# Patient Record
Sex: Female | Born: 1955
Health system: Southern US, Community
[De-identification: ages and names within clinical notes are randomized; demographics above are authoritative.]

## PROBLEM LIST (undated history)

## (undated) DIAGNOSIS — G4730 Sleep apnea, unspecified: Secondary | ICD-10-CM

## (undated) DIAGNOSIS — I10 Essential (primary) hypertension: Secondary | ICD-10-CM

## (undated) DIAGNOSIS — M1990 Unspecified osteoarthritis, unspecified site: Secondary | ICD-10-CM

## (undated) DIAGNOSIS — K759 Inflammatory liver disease, unspecified: Secondary | ICD-10-CM

## (undated) DIAGNOSIS — E119 Type 2 diabetes mellitus without complications: Secondary | ICD-10-CM

## (undated) DIAGNOSIS — M797 Fibromyalgia: Secondary | ICD-10-CM

## (undated) DIAGNOSIS — F329 Major depressive disorder, single episode, unspecified: Secondary | ICD-10-CM

## (undated) DIAGNOSIS — J189 Pneumonia, unspecified organism: Secondary | ICD-10-CM

## (undated) DIAGNOSIS — J45909 Unspecified asthma, uncomplicated: Secondary | ICD-10-CM

## (undated) DIAGNOSIS — K219 Gastro-esophageal reflux disease without esophagitis: Secondary | ICD-10-CM

## (undated) DIAGNOSIS — E785 Hyperlipidemia, unspecified: Secondary | ICD-10-CM

## (undated) DIAGNOSIS — F32A Depression, unspecified: Secondary | ICD-10-CM

## (undated) DIAGNOSIS — D649 Anemia, unspecified: Secondary | ICD-10-CM

## (undated) HISTORY — PX: CARPAL TUNNEL RELEASE: SHX101

## (undated) HISTORY — DX: Hyperlipidemia, unspecified: E78.5

## (undated) HISTORY — PX: HAND TENDON SURGERY: SHX663

## (undated) HISTORY — PX: ABDOMINAL HYSTERECTOMY: SHX81

## (undated) HISTORY — PX: CHOLECYSTECTOMY: SHX55

---

## 2005-09-04 ENCOUNTER — Ambulatory Visit: Payer: Self-pay | Admitting: Gastroenterology

## 2007-07-24 HISTORY — PX: JOINT REPLACEMENT: SHX530

## 2007-11-12 ENCOUNTER — Inpatient Hospital Stay (HOSPITAL_COMMUNITY): Admission: RE | Admit: 2007-11-12 | Discharge: 2007-11-16 | Payer: Self-pay | Admitting: Orthopedic Surgery

## 2009-05-20 IMAGING — CR DG KNEE 1-2V PORT*R*
2 series · 2 of 2 positions shown · non-contrast
Comparison: None

CLINICAL DATA: Postop right knee replacement.

PORTABLE RIGHT KNEE - 1-2 VIEW

[view not recorded (1 of 2)]
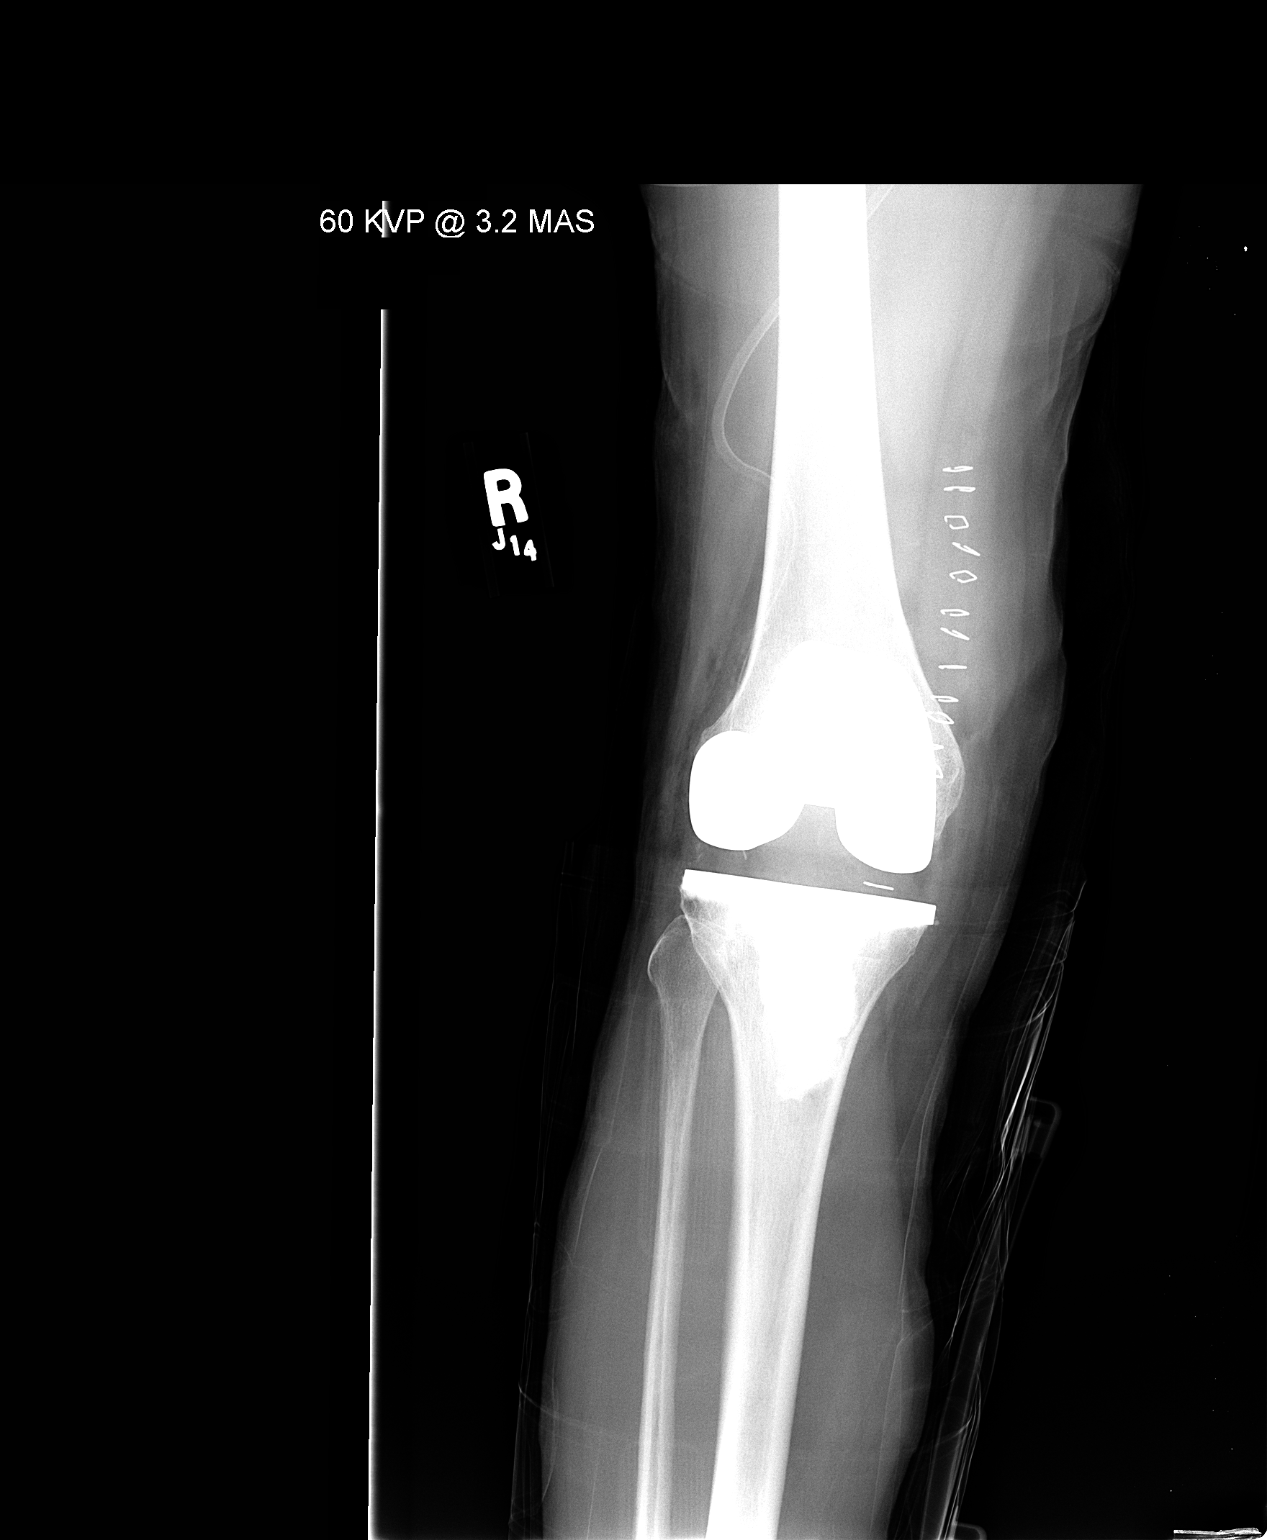

[view not recorded (2 of 2)]
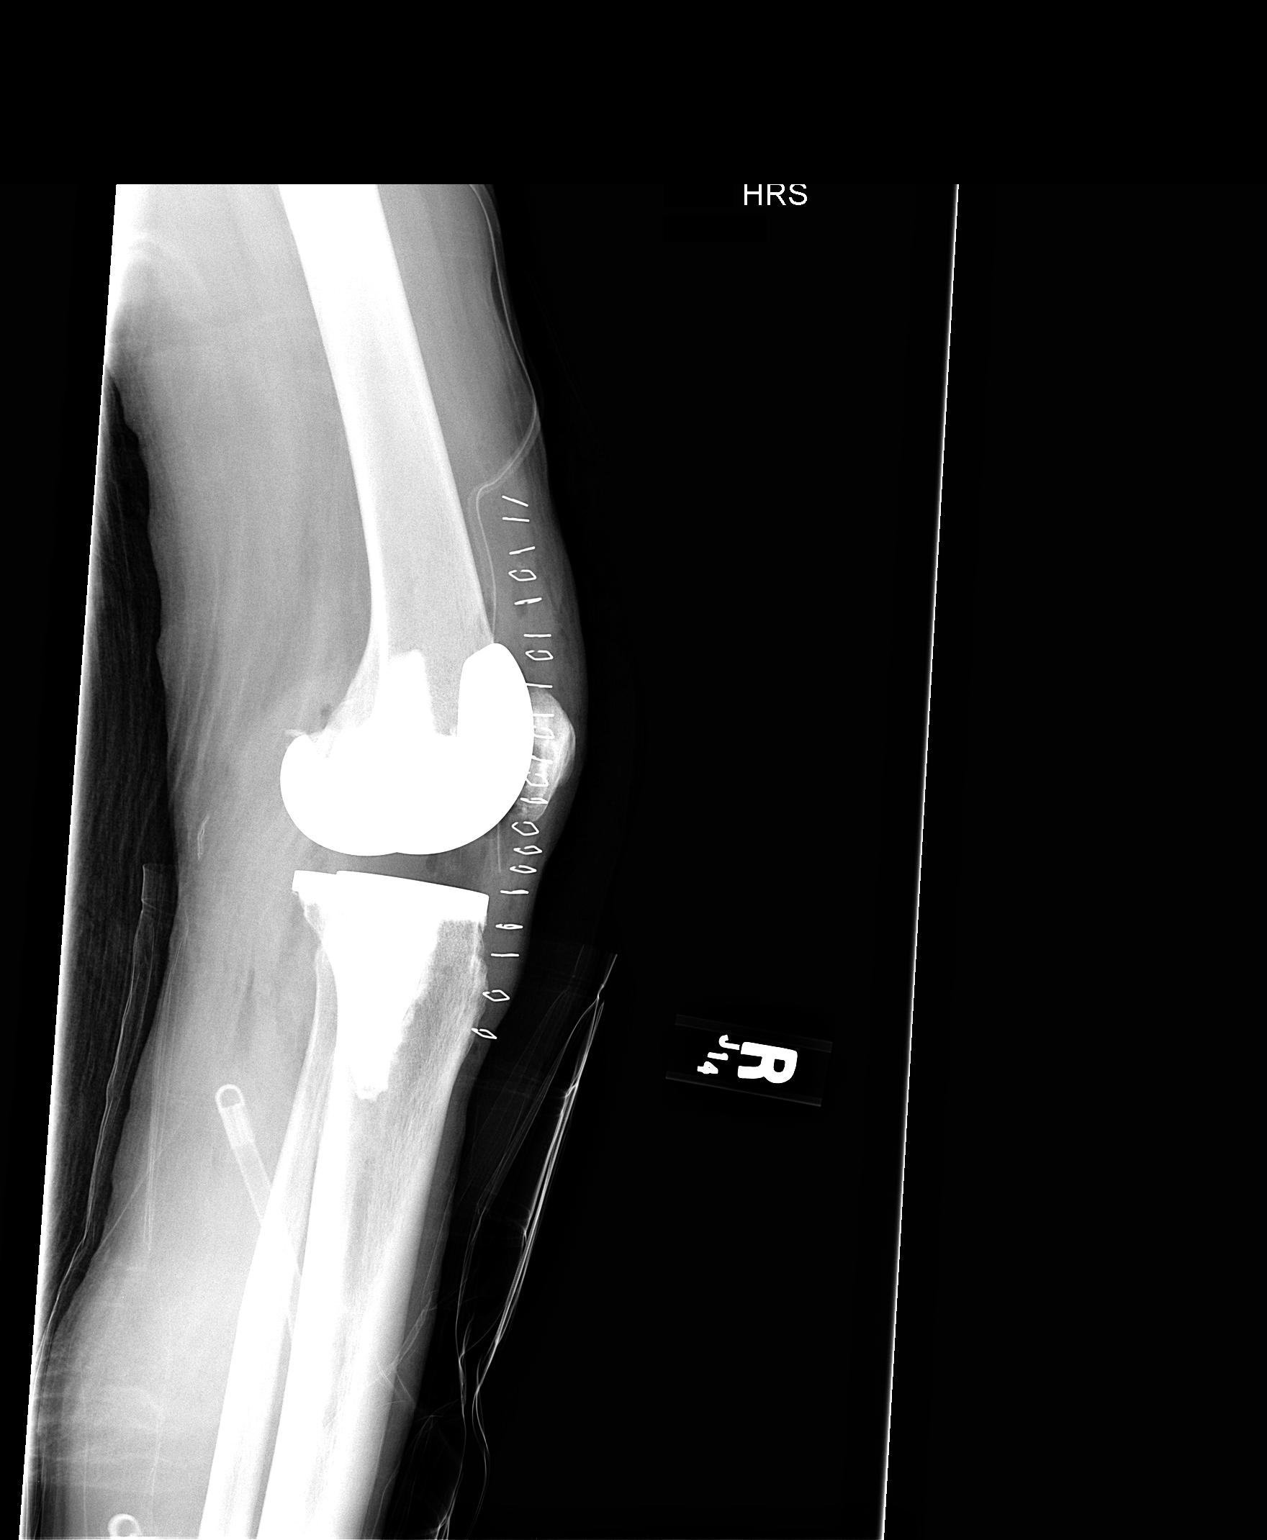

[2 of 2 positions shown; findings below may reference images not displayed]

FINDINGS: The patient is status post right total knee arthroplasty.
Subcutaneous and joint air are noted.  Surgical drain and skin
staples are in place.
IMPRESSION: Right total knee arthroplasty without immediate complicating
feature.

## 2010-11-30 DIAGNOSIS — R52 Pain, unspecified: Secondary | ICD-10-CM

## 2010-11-30 DIAGNOSIS — M25569 Pain in unspecified knee: Secondary | ICD-10-CM

## 2010-11-30 HISTORY — DX: Pain, unspecified: R52

## 2010-11-30 HISTORY — DX: Pain in unspecified knee: M25.569

## 2010-12-05 NOTE — H&P (Signed)
NAME:  Natasha Meyer, Natasha Meyer           ACCOUNT NO.:  [id number]   MEDICAL RECORD NO.:  [id number]          PATIENT TYPE:  INP   LOCATION:  NA                           FACILITY:  [facility]   PHYSICIAN:  Georges Lynch. Gioffre, M.D.DATE OF BIRTH:  Nov 28, 1955   DATE OF ADMISSION:  11/12/2007  DATE OF DISCHARGE:                              HISTORY & PHYSICAL   New married name [name]   CHIEF COMPLAINT:  Painful loss of range of motion, right knee.   HISTORY OF PRESENT ILLNESS:  The patient is a 55 year old female here  today for history and physical of her upcoming right total knee  arthroplasty by Dr. Darrelyn Hillock at [facility] on November 12, 2007.  The patient has a long-term history of painful range of motion of the  right knee.  Her x-rays reveal significant arthritic changes of the  knee.  The patient had failed arthroscopy and conservative treatment and  elected proceed with a knee replacement.   ALLERGIES:  1. PREDNISONE.  2. CODEINE.  3. HYDROCODONE.  4. ZESTRIL.  5. SULFA.  6. THE PATIENT IS TO AVOID ANY TYLENOL PRODUCT DUE TO HEPATITIS C.  7. LATEX ALLERGY.   CURRENT MEDICATIONS:  1. Premarin 0.625 mg a day.  2. Ropinirole HCL 0.5 mg nightly.  3. __________ 100 mg p.o. nightly.  4. __________ 20/12.5 mg 1 tablet a day.  5. Clonazepam 1 mg three times a day as needed.  6. Xopenex inhaling nebulization 0.63 mg in 3 mL once four times a      day.  7. OxyIR 5 mg 1 tablet four times a day.  8. Spiriva multidose inhaler p.r.n.   PAST MEDICAL HISTORY:  1. Right knee degenerative arthritis.  2. COPD.  3. Depressive disorder.  4. Anxiety.  5. Hypertension.  6. Reflux disease.  7. Asymptomatic hepatitis C.  8. Recent pneumonia in which she was hospitalized in November.  9. Tobacco use.   REVIEW OF SYSTEMS:  Positive for some anxiety and depression, but she  states it is fairly well stable on her current medications.  PULMONARY:  She has had recent pulmonary bronchitis and  pneumonia for which she was  hospitalized in November.  She states she has recently had a pretty good  respiratory status.  She does smoke about one pack a day, and she was  states she wants to quit.  CARDIOVASCULAR:  She denies any recent  cardiac chest pains, irregular heart rhythms or hypertension.  Her  medications been stable.  REFLUX:  She does have some issues with some  reflux disease.  She has been diagnosed with hepatitis C a long time  ago, possibly from a blood transfusion.  She states she is currently  asymptomatic.  She does have issues with hemorrhoids.  GU:  She does  have a little bit of urinary incontinence.  ENDOCRINE:  She denies any  thyroid disease.  She says she has had a couple of low hypoglycemic  events in the past, nothing recently.  HEMATOLOGIC:  She has had a blood  transfusion the past in which she thinks she developed hepatitis C,  but  no other issues.  She is postmenopausal.   PAST SURGICAL HISTORY:  1. T&A in 1974.  2. Hysterectomy in 1988.  3. Trigger fingers, carpal tunnel release in 2001.  4. Arthroscopies in 2006 in 2008.  She states the only complication      she had is a little nausea with anesthesia.   FAMILY MEDICAL HISTORY:  Father is alive at age of 71, with a history of  CABG and hypertension.  Mother is alive at 19 years of age with diabetes  and a hiatal hernia.   SOCIAL HISTORY:  The patient is recently married.  She is currently a  CNA.  She currently smokes about one pack a day for last 31 years.  She  denies any alcohol or drug use.  She has one daughter.  She will be  living at home in a one-story house with her husband.   PRIMARY CARE PHYSICIAN:  Dr. Foye Deer in [facility], and he has done a medical evaluation and has cleared her for  this upcoming surgical procedure.   PHYSICAL EXAMINATION:  VITAL SIGNS:  Height is 4 feet 10 inches, weight  is 118 pounds.  Blood pressure is 104/62, pulse of 84 and  regular,  respirations are 16 and nonlabored.  She is afebrile.  GENERAL:  This is short-statured healthy-appearing female.  Conscious,  alert and appropriate.  She obviously smokes.  She has a small odor of  tobacco.  HEENT:  Head was normocephalic.  Gross hearing is intact.  Oral buccal  mucosa is pink and moist.  Dentition is in fair repair.  NECK:  Supple.  Good range of motion.  No palpable lymphadenopathy.  CHEST:  Lung sounds; she did have a little bit of inspiratory wheezing  bilaterally, but lung sounds were equal and clear.  HEART:  Regular rate and rhythm.  No murmurs, rubs or gallops.  ABDOMEN:  Soft.  Bowel sounds present.  UPPER EXTREMITIES:  With good range of motion of the shoulders, elbows  and wrists.  Motor strength 5/5.  LOWER EXTREMITIES:  Both hips have full extension, flexion up to 130  with 20-30 degrees internal-external rotation without any discomfort.  Right knee, she was very gingerly able to fully extend it.  She had an  obvious valgus deformity.  She was able to flex it back to about 110  degrees.  She did have some play with valgus-varus stressing, but no  gross instability.  No signs of infection.  Her calf was soft and  nontender.  Left knee had full extension and flexion.  No instability.  Calf was soft and nontender.  Ankles had good range of motion.  PERIPHERAL VASCULAR:  Carotid pulses were 2+, no bruits.  Radial pulses  2+.  Dorsalis pedis pulses were 1+.  She had no lower extremity edema.  NEURO:  The patient was conscious, alert and appropriate, appears to be  good historian.  She had no gross neurologic defects noted.  BREAST/RECTAL/GU:  Deferred at this time.   IMPRESSION:  1. Severe osteoarthritis with valgus deformity right knee.  2. Anxiety/depression.  3. Chronic obstructive pulmonary disease with tobacco use with recent      hospitalized pneumonia in November.  4. Hypertension.  5. Reflux disease.  6. Hepatitis C.  7. Some urinary  incontinence.  8. Hemorrhoids.   PLAN:  The patient has been evaluated by her primary care physician and  has been cleared for this upcoming procedure.  The patient will undergo  all routine labs and tests prior to having a right total knee  arthroplasty by Dr. Darrelyn Hillock at [facility] on November 12, 2007.      Jamelle Rushing, P.A.    ______________________________  Georges Lynch Darrelyn Hillock, M.D.    RWK/MEDQ  D:  11/06/2007  T:  11/06/2007  Job:  [id number]   cc:   Windy Fast A. Darrelyn Hillock, M.D.  Fax: [phone]

## 2010-12-05 NOTE — Op Note (Signed)
NAME:  Natasha Meyer, Natasha Meyer           ACCOUNT NO.:  [id number]   MEDICAL RECORD NO.:  [id number]          PATIENT TYPE:  INP   LOCATION:  0001                         FACILITY:  [facility]   PHYSICIAN:  Georges Lynch. Gioffre, M.D.DATE OF BIRTH:  [birth year]   DATE OF PROCEDURE:  11/12/2007  DATE OF DISCHARGE:                               OPERATIVE REPORT   SURGEON:  Georges Lynch. Darrelyn Hillock, M.D.   ASSISTANT:  Jamelle Rushing, P.A.   PREOPERATIVE DIAGNOSIS:  Severe degenerative arthritis of the right knee  with valgus deformity.   POSTOPERATIVE DIAGNOSIS:  Severe degenerative arthritis of the right  knee with valgus deformity.   OPERATION:  Right total knee arthroplasty utilizing the DePuy system.  I  utilized a size two cemented tibial tray with a rotating platform 10 mm  thickness, size 2.  The femoral component was a size 2 posterior  stabilized.  The patella was a 32-mm patella was three pegs.  I utilized  vancomycin in the cement.  All three components were cemented.   PROCEDURE:  Under spinal anesthesia routine orthopedic prep and draping  of the right lower extremity was carried out.  At this time the leg was  exsanguinated and Esmarch tourniquet was elevated 300 mmHg.  Following  that a midline incision was made over the anterior aspect of the right  knee.  Bleeders identified and cauterized.  I then carried out a median  parapatellar approach, reflected patella laterally, flexed the knee.  I  did medial and lateral meniscectomies and excised the anterior posterior  cruciate ligament.  Following that, I did a synovectomy.  Following  that, initial drill hole was made in the intercondylar notch of the  femur, #1 jig was inserted and  11 mm thickness of distal femur was  removed.  I next utilized a sizing guide, measured the femur to be a  size 2.  We next inserted #2 jig, then did our anterior-posterior  chamfer cuts for a size 2 femur.  Following that we went on and prepared  the tibia  measured tibia tray to be a size 2.  We cut our keel cut of  the tibia plateau in the usual fashion.  We then went on and made our  notch cut of the femur and then inserted our trial components.  We felt  a size 2 femur, size 2 tibia with a 10 mm thickness insert was the most  stable with good flexion, good extension and good lateral medial  stability.  Note, we did this only after we used our spacer blocks.  We  then prepared the patella, removed the appropriate amount of patella off  for resurfacing procedure.  Three drill holes were made in the patella  for a size 32 patella.  The removed all components, water-picked the  knee outside the knee and inserted Gelfoam up into the femoral canal,  down into the tibial canal and then cemented all three components in  simultaneously, utilizing vancomycin in the cement.  Following that we  then injected 30 mL of 0.5% Marcaine with epinephrine and Toradol 30 mg.  We injected in  the soft tissue structures.  We then closed the wound  layers in the usual fashion after the permanent tibial insert was  inserted.  We closed the wound over Hemovac drain.  Skin was closed with  metal staples.  Sterile Neosporin dressing was applied.  The patient had  1 gram of IV Ancef preop.           ______________________________  Georges Lynch. Darrelyn Hillock, M.D.    RAG/MEDQ  D:  11/12/2007  T:  11/12/2007  Job:  [id number]

## 2010-12-08 NOTE — Discharge Summary (Signed)
NAME:  Natasha Meyer, Natasha Meyer           ACCOUNT NO.:  [id number]   MEDICAL RECORD NO.:  [id number]          PATIENT TYPE:  INP   LOCATION:  1528                         FACILITY:  [facility]   PHYSICIAN:  Georges Lynch. Gioffre, M.D.DATE OF BIRTH:  1956/04/09   DATE OF ADMISSION:  11/12/2007  DATE OF DISCHARGE:  11/16/2007                               DISCHARGE SUMMARY   ADMISSION DIAGNOSES:  1. Severe osteoarthritis with deformity of the right knee.  2. Anxiety/depression.  3. Chronic obstructive pulmonary disease.  4. Tobacco use.  5. Hypertension.  6. Reflux disease.  7. History of hepatitis C.  8. History of urinary incontinence.  9. Hemorrhoids.   DISCHARGE DIAGNOSES:  1. Right total knee arthroplasty.  2. Chronic obstructive pulmonary disease.  3. History of tobacco use.  4. Anxiety/depression.  5. Hypertension.  6. Reflux disease.  7. History of hepatitis C, told to avoid Tylenol.  8. History urinary incontinence.  9. History of hemorrhoids.   HISTORY OF PRESENT ILLNESS:  The patient is a 55 year old female with a  long-term history of painful range of motion of her right knee. X-rays  reveal she has significant arthritic changes of the knee with valgus  deformity.  She has failed arthroscopies, conservative treatment and has  elected to proceed with a total knee arthroplasty.   ALLERGIES:  PREDNISONE, CODEINE, HYDROCODONE, ZESTRIL, SULFA, LATEX  ALLERGY, WAS TOLD TO VOID TYLENOL DUE TO HEPATITIS C.   CURRENT MEDICATIONS:  1. Premarin 0.625 mg a day.  2. Ropinirole HCL 0.5 mg a night.  3. __________ 100 mg at night.  4. Quinapril/hydrochlorothiazide 20/12.5 mg a day.  5. Clonazepam 1 mg three times a day.  6. Xopenex inhaling nebulizer 0.63 mg in 3 mL four times a day.  7. OxyIR 1 tablet four times a day.  8. Spiriva multidose inhaler p.r.n.   SURGICAL PROCEDURES:  On November 12, 2007 the patient was taken to the OR  by Dr. Worthy Rancher, assisted by Oneida Alar PA-C.  Under  general anesthesia  the patient underwent a right total knee arthroplasty without any  complications under general anesthesia. The patient had minimal blood  loss.  The patient was transferred to the recovery room and the  orthopedic floor in good condition for total knee protocol with IV pain  medicines, antibiotics and DVT prophylaxis.  The patient had the  following components implanted:  A size two right femoral component,  size two keel tibial tray, a size 2, 10-mm polyethylene bearing, a size  32 three peg patella.  All components were implanted with polymethyl  methacrylate with vancomycin mixed in.   CONSULTS:  The following routine consults requested:  Physical therapy,  case management, pharmacy for DVT prophylaxis.   HOSPITAL COURSE:  On November 12, 2007 the patient was admitted to [facility] under the care of Dr. Worthy Rancher.  The patient was taken  to the OR where a right total knee arthroplasty was performed without  any complications.  The patient was transferred to recovery room then to  orthopedic floor in good condition for total knee protocol with IV  antibiotics, pain medicines and Coumadin and heparin for DVT  prophylaxis.  The patient then incurred 3 days postoperative course in  which the patient initially had quite a bit of discomfort in her knee  but she was encouraged to use the p.o. medications along with the PCA  and this did bring her pain under control.  The patient's pain  medication was changed over to OxyIR p.o. to avoid the Tylenol.  The  patient had a preoperative potassium of 3.2 and this was rechecked and  it was 3.6 in the hospital so no changes were made.  The patient's wound  remained benign for signs of infections.  Her leg remained neuromotor  vascularly intact.  The patient was able to wean off  IV pain medicines  to be well-controlled on p.o. medications.  The patient continued to  work well with physical therapy.  The patient was  felt to be  orthopedically stable and medically stable, ready for discharge home on  postop day #4.  Arrangements were made for outpatient home health and  she was discharged in good condition.   LABORATORY DATA:  WBCs on admission found WBCs 13.5, hemoglobin 14.5,  hematocrit 43.5, platelets 382.  Hemoglobin on discharge was 11.2 with a  hematocrit of 32.7.  INR on discharge was 2.9 with the pharmacy  adjusting her Coumadin.  The patient's pre admissions chemistries found  potassium of 3.2 and she called in some potassium preoperatively and  take that.  Postop her sodium was 134, potassium was 3.6, glucose 200,  BUN 3, creatinine 0.51 with an estimated GFR greater than 60.  Her  glucose was related to her diet and inactivity during her  hospitalization.  Her glucose was 95 on admission so no changes were  made.  Urinalysis on admission was normal.   EKG was normal sinus rhythm of 91 beats on admission.   Chest x-ray on admission found no active disease.   DISCHARGE INSTRUCTIONS:   DIET:  No restrictions.   ACTIVITY:  The patient is to slowly increase activity with use of a  walker.   WOUND CARE:  The patient is to change her dressing on a daily basis.   FOLLOW UP:  The patient needs a follow-up appointment with Dr. Darrelyn Hillock  in his office 2 weeks from date of discharge.  The patient is to call  [phone] for that follow-up appointment.   MEDICATIONS:  1. Premarin 0.625 mg once a day.  2. Quinapril/hydrochlorothiazide 20/12.5 mg once a day.  3. Ropinirole 0.5 mg once a day.  4. __________ 100 mg once a day.  5. Clonazepam 1 mg three times a day if needed.  6. Xopenex inhaler 45 mcg two puffs four times a day.  7. Spiriva hand held neb once day as needed.  8. Robaxin 500 mg one tablet every six hours if needed.  9. OxyIR 5 mg one to two tablets every four to six hours for pain if      needed.  10.Coumadin to be adjusted by [facility]       pharmacist.   CONDITION:  The patient's condition upon discharge to home is listed as  improved and good.      Jamelle Rushing, P.A.    ______________________________  Georges Lynch Darrelyn Hillock, M.D.    RWK/MEDQ  D:  11/26/2007  T:  11/26/2007  Job:  [id number]   cc:   Windy Fast A. Darrelyn Hillock, M.D.  Fax: [phone]

## 2011-04-17 LAB — DIFFERENTIAL
Basophils Absolute: 0.2 — ABNORMAL HIGH
Basophils Relative: 1
Eosinophils Absolute: 0.9 — ABNORMAL HIGH
Monocytes Absolute: 1.1 — ABNORMAL HIGH
Monocytes Relative: 8

## 2011-04-17 LAB — HEMOGLOBIN AND HEMATOCRIT, BLOOD
HCT: 32.7 — ABNORMAL LOW
HCT: 33.2 — ABNORMAL LOW
HCT: 35.3 — ABNORMAL LOW
HCT: 38.6
Hemoglobin: 11.2 — ABNORMAL LOW
Hemoglobin: 12.2
Hemoglobin: 13.1

## 2011-04-17 LAB — BASIC METABOLIC PANEL
BUN: 3 — ABNORMAL LOW
CO2: 27
Chloride: 99
Creatinine, Ser: 0.51
Glucose, Bld: 200 — ABNORMAL HIGH

## 2011-04-17 LAB — CBC
HCT: 43.5
Hemoglobin: 14.5
Platelets: 382
RDW: 13.9
WBC: 13.5 — ABNORMAL HIGH

## 2011-04-17 LAB — PROTIME-INR
INR: 1.9 — ABNORMAL HIGH
Prothrombin Time: 22.7 — ABNORMAL HIGH
Prothrombin Time: 31.3 — ABNORMAL HIGH

## 2011-04-17 LAB — COMPREHENSIVE METABOLIC PANEL
ALT: 25
Albumin: 3.6
Alkaline Phosphatase: 68
Chloride: 99
Glucose, Bld: 95
Potassium: 3.2 — ABNORMAL LOW
Sodium: 140
Total Bilirubin: 0.7
Total Protein: 6.6

## 2011-04-17 LAB — URINALYSIS, ROUTINE W REFLEX MICROSCOPIC
Glucose, UA: NEGATIVE
Ketones, ur: NEGATIVE
Nitrite: NEGATIVE
Specific Gravity, Urine: 1.006
pH: 7

## 2011-04-17 LAB — TYPE AND SCREEN

## 2011-04-17 LAB — ABO/RH: ABO/RH(D): O POS

## 2013-05-07 DIAGNOSIS — B182 Chronic viral hepatitis C: Secondary | ICD-10-CM | POA: Insufficient documentation

## 2013-05-07 HISTORY — DX: Chronic viral hepatitis C: B18.2

## 2015-05-27 DIAGNOSIS — M1712 Unilateral primary osteoarthritis, left knee: Secondary | ICD-10-CM

## 2015-05-27 HISTORY — DX: Unilateral primary osteoarthritis, left knee: M17.12

## 2015-07-26 DIAGNOSIS — D500 Iron deficiency anemia secondary to blood loss (chronic): Secondary | ICD-10-CM | POA: Diagnosis not present

## 2015-07-26 DIAGNOSIS — K2960 Other gastritis without bleeding: Secondary | ICD-10-CM | POA: Diagnosis not present

## 2015-07-26 DIAGNOSIS — K589 Irritable bowel syndrome without diarrhea: Secondary | ICD-10-CM | POA: Diagnosis not present

## 2015-07-26 DIAGNOSIS — J310 Chronic rhinitis: Secondary | ICD-10-CM | POA: Diagnosis not present

## 2015-07-28 DIAGNOSIS — N951 Menopausal and female climacteric states: Secondary | ICD-10-CM | POA: Diagnosis not present

## 2015-07-28 DIAGNOSIS — Z0000 Encounter for general adult medical examination without abnormal findings: Secondary | ICD-10-CM | POA: Diagnosis not present

## 2015-07-28 DIAGNOSIS — M1712 Unilateral primary osteoarthritis, left knee: Secondary | ICD-10-CM | POA: Diagnosis not present

## 2015-07-28 DIAGNOSIS — M25462 Effusion, left knee: Secondary | ICD-10-CM | POA: Diagnosis not present

## 2015-07-28 DIAGNOSIS — I10 Essential (primary) hypertension: Secondary | ICD-10-CM | POA: Diagnosis not present

## 2015-07-28 DIAGNOSIS — G629 Polyneuropathy, unspecified: Secondary | ICD-10-CM | POA: Diagnosis not present

## 2015-07-28 DIAGNOSIS — E119 Type 2 diabetes mellitus without complications: Secondary | ICD-10-CM | POA: Diagnosis not present

## 2015-07-28 DIAGNOSIS — M25562 Pain in left knee: Secondary | ICD-10-CM | POA: Diagnosis not present

## 2015-07-29 DIAGNOSIS — G4730 Sleep apnea, unspecified: Secondary | ICD-10-CM | POA: Diagnosis not present

## 2015-07-29 DIAGNOSIS — Z720 Tobacco use: Secondary | ICD-10-CM | POA: Diagnosis not present

## 2015-07-29 DIAGNOSIS — M5136 Other intervertebral disc degeneration, lumbar region: Secondary | ICD-10-CM | POA: Diagnosis not present

## 2015-07-29 DIAGNOSIS — M4696 Unspecified inflammatory spondylopathy, lumbar region: Secondary | ICD-10-CM | POA: Diagnosis not present

## 2015-07-29 DIAGNOSIS — G894 Chronic pain syndrome: Secondary | ICD-10-CM | POA: Diagnosis not present

## 2015-07-29 DIAGNOSIS — Z96651 Presence of right artificial knee joint: Secondary | ICD-10-CM | POA: Diagnosis not present

## 2015-07-29 DIAGNOSIS — M179 Osteoarthritis of knee, unspecified: Secondary | ICD-10-CM | POA: Diagnosis not present

## 2015-08-05 DIAGNOSIS — D509 Iron deficiency anemia, unspecified: Secondary | ICD-10-CM | POA: Diagnosis not present

## 2015-08-09 DIAGNOSIS — J310 Chronic rhinitis: Secondary | ICD-10-CM | POA: Diagnosis not present

## 2015-08-12 DIAGNOSIS — Z1231 Encounter for screening mammogram for malignant neoplasm of breast: Secondary | ICD-10-CM | POA: Diagnosis not present

## 2015-08-23 DIAGNOSIS — J310 Chronic rhinitis: Secondary | ICD-10-CM | POA: Diagnosis not present

## 2015-08-29 DIAGNOSIS — M4696 Unspecified inflammatory spondylopathy, lumbar region: Secondary | ICD-10-CM | POA: Diagnosis not present

## 2015-08-29 DIAGNOSIS — Z96651 Presence of right artificial knee joint: Secondary | ICD-10-CM | POA: Diagnosis not present

## 2015-08-29 DIAGNOSIS — M179 Osteoarthritis of knee, unspecified: Secondary | ICD-10-CM | POA: Diagnosis not present

## 2015-08-29 DIAGNOSIS — M5136 Other intervertebral disc degeneration, lumbar region: Secondary | ICD-10-CM | POA: Diagnosis not present

## 2015-08-29 DIAGNOSIS — Z720 Tobacco use: Secondary | ICD-10-CM | POA: Diagnosis not present

## 2015-08-29 DIAGNOSIS — G4730 Sleep apnea, unspecified: Secondary | ICD-10-CM | POA: Diagnosis not present

## 2015-08-29 DIAGNOSIS — G894 Chronic pain syndrome: Secondary | ICD-10-CM | POA: Diagnosis not present

## 2015-08-30 DIAGNOSIS — M1712 Unilateral primary osteoarthritis, left knee: Secondary | ICD-10-CM | POA: Diagnosis not present

## 2015-08-30 DIAGNOSIS — J310 Chronic rhinitis: Secondary | ICD-10-CM | POA: Diagnosis not present

## 2015-09-14 DIAGNOSIS — J449 Chronic obstructive pulmonary disease, unspecified: Secondary | ICD-10-CM | POA: Diagnosis not present

## 2015-09-14 DIAGNOSIS — J310 Chronic rhinitis: Secondary | ICD-10-CM | POA: Diagnosis not present

## 2015-09-21 DIAGNOSIS — J310 Chronic rhinitis: Secondary | ICD-10-CM | POA: Diagnosis not present

## 2015-09-26 DIAGNOSIS — M5136 Other intervertebral disc degeneration, lumbar region: Secondary | ICD-10-CM | POA: Diagnosis not present

## 2015-09-26 DIAGNOSIS — Z96651 Presence of right artificial knee joint: Secondary | ICD-10-CM | POA: Diagnosis not present

## 2015-09-26 DIAGNOSIS — G4730 Sleep apnea, unspecified: Secondary | ICD-10-CM | POA: Diagnosis not present

## 2015-09-26 DIAGNOSIS — M4696 Unspecified inflammatory spondylopathy, lumbar region: Secondary | ICD-10-CM | POA: Diagnosis not present

## 2015-09-26 DIAGNOSIS — M179 Osteoarthritis of knee, unspecified: Secondary | ICD-10-CM | POA: Diagnosis not present

## 2015-09-26 DIAGNOSIS — M47816 Spondylosis without myelopathy or radiculopathy, lumbar region: Secondary | ICD-10-CM | POA: Diagnosis not present

## 2015-09-26 DIAGNOSIS — G894 Chronic pain syndrome: Secondary | ICD-10-CM | POA: Diagnosis not present

## 2015-09-26 DIAGNOSIS — F1120 Opioid dependence, uncomplicated: Secondary | ICD-10-CM | POA: Diagnosis not present

## 2015-09-26 DIAGNOSIS — Z720 Tobacco use: Secondary | ICD-10-CM | POA: Diagnosis not present

## 2015-10-10 DIAGNOSIS — J310 Chronic rhinitis: Secondary | ICD-10-CM | POA: Diagnosis not present

## 2015-10-11 DIAGNOSIS — M1712 Unilateral primary osteoarthritis, left knee: Secondary | ICD-10-CM | POA: Diagnosis not present

## 2015-10-16 DIAGNOSIS — M19011 Primary osteoarthritis, right shoulder: Secondary | ICD-10-CM | POA: Diagnosis not present

## 2015-10-16 DIAGNOSIS — M25511 Pain in right shoulder: Secondary | ICD-10-CM | POA: Diagnosis not present

## 2015-10-24 DIAGNOSIS — Z720 Tobacco use: Secondary | ICD-10-CM | POA: Diagnosis not present

## 2015-10-24 DIAGNOSIS — M179 Osteoarthritis of knee, unspecified: Secondary | ICD-10-CM | POA: Diagnosis not present

## 2015-10-24 DIAGNOSIS — Z96651 Presence of right artificial knee joint: Secondary | ICD-10-CM | POA: Diagnosis not present

## 2015-10-24 DIAGNOSIS — M4696 Unspecified inflammatory spondylopathy, lumbar region: Secondary | ICD-10-CM | POA: Diagnosis not present

## 2015-10-24 DIAGNOSIS — M5136 Other intervertebral disc degeneration, lumbar region: Secondary | ICD-10-CM | POA: Diagnosis not present

## 2015-10-24 DIAGNOSIS — G894 Chronic pain syndrome: Secondary | ICD-10-CM | POA: Diagnosis not present

## 2015-10-24 DIAGNOSIS — G4730 Sleep apnea, unspecified: Secondary | ICD-10-CM | POA: Diagnosis not present

## 2015-10-24 DIAGNOSIS — J310 Chronic rhinitis: Secondary | ICD-10-CM | POA: Diagnosis not present

## 2015-10-24 DIAGNOSIS — F1120 Opioid dependence, uncomplicated: Secondary | ICD-10-CM | POA: Diagnosis not present

## 2015-10-24 DIAGNOSIS — M47816 Spondylosis without myelopathy or radiculopathy, lumbar region: Secondary | ICD-10-CM | POA: Diagnosis not present

## 2015-11-14 DIAGNOSIS — J310 Chronic rhinitis: Secondary | ICD-10-CM | POA: Diagnosis not present

## 2015-11-22 DIAGNOSIS — J449 Chronic obstructive pulmonary disease, unspecified: Secondary | ICD-10-CM | POA: Diagnosis not present

## 2015-11-22 DIAGNOSIS — J310 Chronic rhinitis: Secondary | ICD-10-CM | POA: Diagnosis not present

## 2015-11-22 DIAGNOSIS — D500 Iron deficiency anemia secondary to blood loss (chronic): Secondary | ICD-10-CM | POA: Diagnosis not present

## 2015-11-25 DIAGNOSIS — G894 Chronic pain syndrome: Secondary | ICD-10-CM | POA: Diagnosis not present

## 2015-11-25 DIAGNOSIS — G4730 Sleep apnea, unspecified: Secondary | ICD-10-CM | POA: Diagnosis not present

## 2015-11-25 DIAGNOSIS — Z96651 Presence of right artificial knee joint: Secondary | ICD-10-CM | POA: Diagnosis not present

## 2015-11-25 DIAGNOSIS — M4696 Unspecified inflammatory spondylopathy, lumbar region: Secondary | ICD-10-CM | POA: Diagnosis not present

## 2015-11-25 DIAGNOSIS — M5136 Other intervertebral disc degeneration, lumbar region: Secondary | ICD-10-CM | POA: Diagnosis not present

## 2015-11-25 DIAGNOSIS — M47816 Spondylosis without myelopathy or radiculopathy, lumbar region: Secondary | ICD-10-CM | POA: Diagnosis not present

## 2015-11-25 DIAGNOSIS — F1120 Opioid dependence, uncomplicated: Secondary | ICD-10-CM | POA: Diagnosis not present

## 2015-11-25 DIAGNOSIS — M179 Osteoarthritis of knee, unspecified: Secondary | ICD-10-CM | POA: Diagnosis not present

## 2015-11-25 DIAGNOSIS — Z720 Tobacco use: Secondary | ICD-10-CM | POA: Diagnosis not present

## 2015-12-01 DIAGNOSIS — D500 Iron deficiency anemia secondary to blood loss (chronic): Secondary | ICD-10-CM | POA: Diagnosis not present

## 2015-12-01 DIAGNOSIS — K2960 Other gastritis without bleeding: Secondary | ICD-10-CM | POA: Diagnosis not present

## 2015-12-01 DIAGNOSIS — K589 Irritable bowel syndrome without diarrhea: Secondary | ICD-10-CM | POA: Diagnosis not present

## 2015-12-07 DIAGNOSIS — F1120 Opioid dependence, uncomplicated: Secondary | ICD-10-CM | POA: Diagnosis not present

## 2015-12-07 DIAGNOSIS — Z720 Tobacco use: Secondary | ICD-10-CM | POA: Diagnosis not present

## 2015-12-07 DIAGNOSIS — G4730 Sleep apnea, unspecified: Secondary | ICD-10-CM | POA: Diagnosis not present

## 2015-12-07 DIAGNOSIS — M179 Osteoarthritis of knee, unspecified: Secondary | ICD-10-CM | POA: Diagnosis not present

## 2015-12-07 DIAGNOSIS — M47816 Spondylosis without myelopathy or radiculopathy, lumbar region: Secondary | ICD-10-CM | POA: Diagnosis not present

## 2015-12-07 DIAGNOSIS — Z96651 Presence of right artificial knee joint: Secondary | ICD-10-CM | POA: Diagnosis not present

## 2015-12-07 DIAGNOSIS — G894 Chronic pain syndrome: Secondary | ICD-10-CM | POA: Diagnosis not present

## 2015-12-07 DIAGNOSIS — M4696 Unspecified inflammatory spondylopathy, lumbar region: Secondary | ICD-10-CM | POA: Diagnosis not present

## 2015-12-07 DIAGNOSIS — M5136 Other intervertebral disc degeneration, lumbar region: Secondary | ICD-10-CM | POA: Diagnosis not present

## 2015-12-26 DIAGNOSIS — Z720 Tobacco use: Secondary | ICD-10-CM | POA: Diagnosis not present

## 2015-12-26 DIAGNOSIS — M4696 Unspecified inflammatory spondylopathy, lumbar region: Secondary | ICD-10-CM | POA: Diagnosis not present

## 2015-12-26 DIAGNOSIS — F1120 Opioid dependence, uncomplicated: Secondary | ICD-10-CM | POA: Diagnosis not present

## 2015-12-26 DIAGNOSIS — Z96651 Presence of right artificial knee joint: Secondary | ICD-10-CM | POA: Diagnosis not present

## 2015-12-26 DIAGNOSIS — M179 Osteoarthritis of knee, unspecified: Secondary | ICD-10-CM | POA: Diagnosis not present

## 2015-12-26 DIAGNOSIS — G894 Chronic pain syndrome: Secondary | ICD-10-CM | POA: Diagnosis not present

## 2015-12-26 DIAGNOSIS — G4730 Sleep apnea, unspecified: Secondary | ICD-10-CM | POA: Diagnosis not present

## 2015-12-26 DIAGNOSIS — M5136 Other intervertebral disc degeneration, lumbar region: Secondary | ICD-10-CM | POA: Diagnosis not present

## 2015-12-26 DIAGNOSIS — M47816 Spondylosis without myelopathy or radiculopathy, lumbar region: Secondary | ICD-10-CM | POA: Diagnosis not present

## 2015-12-28 DIAGNOSIS — M1712 Unilateral primary osteoarthritis, left knee: Secondary | ICD-10-CM | POA: Diagnosis not present

## 2016-01-23 DIAGNOSIS — M179 Osteoarthritis of knee, unspecified: Secondary | ICD-10-CM | POA: Diagnosis not present

## 2016-01-23 DIAGNOSIS — F1120 Opioid dependence, uncomplicated: Secondary | ICD-10-CM | POA: Diagnosis not present

## 2016-01-23 DIAGNOSIS — Z1389 Encounter for screening for other disorder: Secondary | ICD-10-CM | POA: Diagnosis not present

## 2016-01-23 DIAGNOSIS — G894 Chronic pain syndrome: Secondary | ICD-10-CM | POA: Diagnosis not present

## 2016-01-23 DIAGNOSIS — Z96651 Presence of right artificial knee joint: Secondary | ICD-10-CM | POA: Diagnosis not present

## 2016-01-23 DIAGNOSIS — M47816 Spondylosis without myelopathy or radiculopathy, lumbar region: Secondary | ICD-10-CM | POA: Diagnosis not present

## 2016-01-23 DIAGNOSIS — M4696 Unspecified inflammatory spondylopathy, lumbar region: Secondary | ICD-10-CM | POA: Diagnosis not present

## 2016-01-23 DIAGNOSIS — G4730 Sleep apnea, unspecified: Secondary | ICD-10-CM | POA: Diagnosis not present

## 2016-01-23 DIAGNOSIS — M5136 Other intervertebral disc degeneration, lumbar region: Secondary | ICD-10-CM | POA: Diagnosis not present

## 2016-02-01 DIAGNOSIS — Z96651 Presence of right artificial knee joint: Secondary | ICD-10-CM | POA: Diagnosis not present

## 2016-02-01 DIAGNOSIS — G4730 Sleep apnea, unspecified: Secondary | ICD-10-CM | POA: Diagnosis not present

## 2016-02-01 DIAGNOSIS — M4696 Unspecified inflammatory spondylopathy, lumbar region: Secondary | ICD-10-CM | POA: Diagnosis not present

## 2016-02-01 DIAGNOSIS — M179 Osteoarthritis of knee, unspecified: Secondary | ICD-10-CM | POA: Diagnosis not present

## 2016-02-01 DIAGNOSIS — G894 Chronic pain syndrome: Secondary | ICD-10-CM | POA: Diagnosis not present

## 2016-02-01 DIAGNOSIS — M47816 Spondylosis without myelopathy or radiculopathy, lumbar region: Secondary | ICD-10-CM | POA: Diagnosis not present

## 2016-02-01 DIAGNOSIS — F1120 Opioid dependence, uncomplicated: Secondary | ICD-10-CM | POA: Diagnosis not present

## 2016-02-01 DIAGNOSIS — M5136 Other intervertebral disc degeneration, lumbar region: Secondary | ICD-10-CM | POA: Diagnosis not present

## 2016-02-01 DIAGNOSIS — Z1389 Encounter for screening for other disorder: Secondary | ICD-10-CM | POA: Diagnosis not present

## 2016-02-08 DIAGNOSIS — M1712 Unilateral primary osteoarthritis, left knee: Secondary | ICD-10-CM | POA: Diagnosis not present

## 2016-03-01 DIAGNOSIS — M4696 Unspecified inflammatory spondylopathy, lumbar region: Secondary | ICD-10-CM | POA: Diagnosis not present

## 2016-03-01 DIAGNOSIS — M47816 Spondylosis without myelopathy or radiculopathy, lumbar region: Secondary | ICD-10-CM | POA: Diagnosis not present

## 2016-03-01 DIAGNOSIS — G894 Chronic pain syndrome: Secondary | ICD-10-CM | POA: Diagnosis not present

## 2016-03-01 DIAGNOSIS — G4730 Sleep apnea, unspecified: Secondary | ICD-10-CM | POA: Diagnosis not present

## 2016-03-01 DIAGNOSIS — M179 Osteoarthritis of knee, unspecified: Secondary | ICD-10-CM | POA: Diagnosis not present

## 2016-03-01 DIAGNOSIS — Z96651 Presence of right artificial knee joint: Secondary | ICD-10-CM | POA: Diagnosis not present

## 2016-03-01 DIAGNOSIS — Z1389 Encounter for screening for other disorder: Secondary | ICD-10-CM | POA: Diagnosis not present

## 2016-03-01 DIAGNOSIS — M5136 Other intervertebral disc degeneration, lumbar region: Secondary | ICD-10-CM | POA: Diagnosis not present

## 2016-03-01 DIAGNOSIS — Z79899 Other long term (current) drug therapy: Secondary | ICD-10-CM | POA: Diagnosis not present

## 2016-03-01 DIAGNOSIS — F1120 Opioid dependence, uncomplicated: Secondary | ICD-10-CM | POA: Diagnosis not present

## 2016-04-03 DIAGNOSIS — M179 Osteoarthritis of knee, unspecified: Secondary | ICD-10-CM | POA: Diagnosis not present

## 2016-04-03 DIAGNOSIS — Z79891 Long term (current) use of opiate analgesic: Secondary | ICD-10-CM | POA: Diagnosis not present

## 2016-04-03 DIAGNOSIS — M4696 Unspecified inflammatory spondylopathy, lumbar region: Secondary | ICD-10-CM | POA: Diagnosis not present

## 2016-04-03 DIAGNOSIS — F1120 Opioid dependence, uncomplicated: Secondary | ICD-10-CM | POA: Diagnosis not present

## 2016-04-03 DIAGNOSIS — Z96651 Presence of right artificial knee joint: Secondary | ICD-10-CM | POA: Diagnosis not present

## 2016-04-03 DIAGNOSIS — G4730 Sleep apnea, unspecified: Secondary | ICD-10-CM | POA: Diagnosis not present

## 2016-04-03 DIAGNOSIS — M47816 Spondylosis without myelopathy or radiculopathy, lumbar region: Secondary | ICD-10-CM | POA: Diagnosis not present

## 2016-04-03 DIAGNOSIS — G894 Chronic pain syndrome: Secondary | ICD-10-CM | POA: Diagnosis not present

## 2016-04-03 DIAGNOSIS — M5136 Other intervertebral disc degeneration, lumbar region: Secondary | ICD-10-CM | POA: Diagnosis not present

## 2016-04-04 DIAGNOSIS — Z79891 Long term (current) use of opiate analgesic: Secondary | ICD-10-CM | POA: Diagnosis not present

## 2016-04-04 DIAGNOSIS — M5136 Other intervertebral disc degeneration, lumbar region: Secondary | ICD-10-CM | POA: Diagnosis not present

## 2016-04-04 DIAGNOSIS — Z96651 Presence of right artificial knee joint: Secondary | ICD-10-CM | POA: Diagnosis not present

## 2016-04-04 DIAGNOSIS — M47816 Spondylosis without myelopathy or radiculopathy, lumbar region: Secondary | ICD-10-CM | POA: Diagnosis not present

## 2016-04-04 DIAGNOSIS — M4696 Unspecified inflammatory spondylopathy, lumbar region: Secondary | ICD-10-CM | POA: Diagnosis not present

## 2016-04-04 DIAGNOSIS — G894 Chronic pain syndrome: Secondary | ICD-10-CM | POA: Diagnosis not present

## 2016-04-04 DIAGNOSIS — G4730 Sleep apnea, unspecified: Secondary | ICD-10-CM | POA: Diagnosis not present

## 2016-04-04 DIAGNOSIS — F1120 Opioid dependence, uncomplicated: Secondary | ICD-10-CM | POA: Diagnosis not present

## 2016-04-04 DIAGNOSIS — M179 Osteoarthritis of knee, unspecified: Secondary | ICD-10-CM | POA: Diagnosis not present

## 2016-04-06 DIAGNOSIS — G8929 Other chronic pain: Secondary | ICD-10-CM | POA: Diagnosis not present

## 2016-04-06 DIAGNOSIS — M1712 Unilateral primary osteoarthritis, left knee: Secondary | ICD-10-CM | POA: Diagnosis not present

## 2016-04-06 DIAGNOSIS — M25561 Pain in right knee: Secondary | ICD-10-CM | POA: Diagnosis not present

## 2016-04-25 DIAGNOSIS — H52209 Unspecified astigmatism, unspecified eye: Secondary | ICD-10-CM | POA: Diagnosis not present

## 2016-04-25 DIAGNOSIS — H524 Presbyopia: Secondary | ICD-10-CM | POA: Diagnosis not present

## 2016-04-25 DIAGNOSIS — H5203 Hypermetropia, bilateral: Secondary | ICD-10-CM | POA: Diagnosis not present

## 2016-05-01 DIAGNOSIS — M79645 Pain in left finger(s): Secondary | ICD-10-CM | POA: Diagnosis not present

## 2016-05-03 DIAGNOSIS — M4696 Unspecified inflammatory spondylopathy, lumbar region: Secondary | ICD-10-CM | POA: Diagnosis not present

## 2016-05-03 DIAGNOSIS — M5136 Other intervertebral disc degeneration, lumbar region: Secondary | ICD-10-CM | POA: Diagnosis not present

## 2016-05-03 DIAGNOSIS — Z96651 Presence of right artificial knee joint: Secondary | ICD-10-CM | POA: Diagnosis not present

## 2016-05-03 DIAGNOSIS — G4730 Sleep apnea, unspecified: Secondary | ICD-10-CM | POA: Diagnosis not present

## 2016-05-03 DIAGNOSIS — M47816 Spondylosis without myelopathy or radiculopathy, lumbar region: Secondary | ICD-10-CM | POA: Diagnosis not present

## 2016-05-03 DIAGNOSIS — M179 Osteoarthritis of knee, unspecified: Secondary | ICD-10-CM | POA: Diagnosis not present

## 2016-05-03 DIAGNOSIS — G894 Chronic pain syndrome: Secondary | ICD-10-CM | POA: Diagnosis not present

## 2016-05-03 DIAGNOSIS — Z79891 Long term (current) use of opiate analgesic: Secondary | ICD-10-CM | POA: Diagnosis not present

## 2016-05-03 DIAGNOSIS — F1120 Opioid dependence, uncomplicated: Secondary | ICD-10-CM | POA: Diagnosis not present

## 2016-05-28 DIAGNOSIS — M79645 Pain in left finger(s): Secondary | ICD-10-CM | POA: Diagnosis not present

## 2016-06-05 DIAGNOSIS — M5136 Other intervertebral disc degeneration, lumbar region: Secondary | ICD-10-CM | POA: Diagnosis not present

## 2016-06-05 DIAGNOSIS — M4696 Unspecified inflammatory spondylopathy, lumbar region: Secondary | ICD-10-CM | POA: Diagnosis not present

## 2016-06-05 DIAGNOSIS — G4730 Sleep apnea, unspecified: Secondary | ICD-10-CM | POA: Diagnosis not present

## 2016-06-05 DIAGNOSIS — M179 Osteoarthritis of knee, unspecified: Secondary | ICD-10-CM | POA: Diagnosis not present

## 2016-06-05 DIAGNOSIS — Z96651 Presence of right artificial knee joint: Secondary | ICD-10-CM | POA: Diagnosis not present

## 2016-06-05 DIAGNOSIS — G894 Chronic pain syndrome: Secondary | ICD-10-CM | POA: Diagnosis not present

## 2016-06-05 DIAGNOSIS — Z79891 Long term (current) use of opiate analgesic: Secondary | ICD-10-CM | POA: Diagnosis not present

## 2016-06-05 DIAGNOSIS — F1120 Opioid dependence, uncomplicated: Secondary | ICD-10-CM | POA: Diagnosis not present

## 2016-06-05 DIAGNOSIS — M47816 Spondylosis without myelopathy or radiculopathy, lumbar region: Secondary | ICD-10-CM | POA: Diagnosis not present

## 2016-06-06 DIAGNOSIS — F1120 Opioid dependence, uncomplicated: Secondary | ICD-10-CM | POA: Diagnosis not present

## 2016-06-06 DIAGNOSIS — G4730 Sleep apnea, unspecified: Secondary | ICD-10-CM | POA: Diagnosis not present

## 2016-06-06 DIAGNOSIS — Z79891 Long term (current) use of opiate analgesic: Secondary | ICD-10-CM | POA: Diagnosis not present

## 2016-06-06 DIAGNOSIS — M4696 Unspecified inflammatory spondylopathy, lumbar region: Secondary | ICD-10-CM | POA: Diagnosis not present

## 2016-06-06 DIAGNOSIS — M5136 Other intervertebral disc degeneration, lumbar region: Secondary | ICD-10-CM | POA: Diagnosis not present

## 2016-06-06 DIAGNOSIS — M47816 Spondylosis without myelopathy or radiculopathy, lumbar region: Secondary | ICD-10-CM | POA: Diagnosis not present

## 2016-06-06 DIAGNOSIS — G894 Chronic pain syndrome: Secondary | ICD-10-CM | POA: Diagnosis not present

## 2016-06-06 DIAGNOSIS — Z96651 Presence of right artificial knee joint: Secondary | ICD-10-CM | POA: Diagnosis not present

## 2016-06-06 DIAGNOSIS — M179 Osteoarthritis of knee, unspecified: Secondary | ICD-10-CM | POA: Diagnosis not present

## 2016-06-07 DIAGNOSIS — G8929 Other chronic pain: Secondary | ICD-10-CM | POA: Diagnosis not present

## 2016-06-07 DIAGNOSIS — M1712 Unilateral primary osteoarthritis, left knee: Secondary | ICD-10-CM | POA: Diagnosis not present

## 2016-06-21 DIAGNOSIS — Z01818 Encounter for other preprocedural examination: Secondary | ICD-10-CM | POA: Diagnosis not present

## 2016-06-21 DIAGNOSIS — Z716 Tobacco abuse counseling: Secondary | ICD-10-CM | POA: Diagnosis not present

## 2016-06-21 DIAGNOSIS — E119 Type 2 diabetes mellitus without complications: Secondary | ICD-10-CM | POA: Diagnosis not present

## 2016-06-21 DIAGNOSIS — Z720 Tobacco use: Secondary | ICD-10-CM | POA: Diagnosis not present

## 2016-06-21 DIAGNOSIS — M899 Disorder of bone, unspecified: Secondary | ICD-10-CM | POA: Diagnosis not present

## 2016-06-28 DIAGNOSIS — J310 Chronic rhinitis: Secondary | ICD-10-CM | POA: Diagnosis not present

## 2016-06-28 DIAGNOSIS — G4733 Obstructive sleep apnea (adult) (pediatric): Secondary | ICD-10-CM | POA: Diagnosis not present

## 2016-06-28 DIAGNOSIS — R918 Other nonspecific abnormal finding of lung field: Secondary | ICD-10-CM | POA: Diagnosis not present

## 2016-06-28 DIAGNOSIS — J449 Chronic obstructive pulmonary disease, unspecified: Secondary | ICD-10-CM | POA: Diagnosis not present

## 2016-07-02 DIAGNOSIS — M1812 Unilateral primary osteoarthritis of first carpometacarpal joint, left hand: Secondary | ICD-10-CM | POA: Diagnosis not present

## 2016-07-02 DIAGNOSIS — G8918 Other acute postprocedural pain: Secondary | ICD-10-CM | POA: Diagnosis not present

## 2016-07-02 DIAGNOSIS — M654 Radial styloid tenosynovitis [de Quervain]: Secondary | ICD-10-CM | POA: Diagnosis not present

## 2016-07-05 DIAGNOSIS — G894 Chronic pain syndrome: Secondary | ICD-10-CM | POA: Diagnosis not present

## 2016-07-05 DIAGNOSIS — Z79891 Long term (current) use of opiate analgesic: Secondary | ICD-10-CM | POA: Diagnosis not present

## 2016-07-05 DIAGNOSIS — M5136 Other intervertebral disc degeneration, lumbar region: Secondary | ICD-10-CM | POA: Diagnosis not present

## 2016-07-05 DIAGNOSIS — M179 Osteoarthritis of knee, unspecified: Secondary | ICD-10-CM | POA: Diagnosis not present

## 2016-07-05 DIAGNOSIS — F1120 Opioid dependence, uncomplicated: Secondary | ICD-10-CM | POA: Diagnosis not present

## 2016-07-05 DIAGNOSIS — Z96651 Presence of right artificial knee joint: Secondary | ICD-10-CM | POA: Diagnosis not present

## 2016-07-05 DIAGNOSIS — M4696 Unspecified inflammatory spondylopathy, lumbar region: Secondary | ICD-10-CM | POA: Diagnosis not present

## 2016-07-05 DIAGNOSIS — M79645 Pain in left finger(s): Secondary | ICD-10-CM

## 2016-07-05 DIAGNOSIS — M47816 Spondylosis without myelopathy or radiculopathy, lumbar region: Secondary | ICD-10-CM | POA: Diagnosis not present

## 2016-07-05 DIAGNOSIS — G4730 Sleep apnea, unspecified: Secondary | ICD-10-CM | POA: Diagnosis not present

## 2016-07-05 HISTORY — DX: Pain in left finger(s): M79.645

## 2016-07-25 DIAGNOSIS — G4733 Obstructive sleep apnea (adult) (pediatric): Secondary | ICD-10-CM | POA: Diagnosis not present

## 2016-07-25 DIAGNOSIS — J4530 Mild persistent asthma, uncomplicated: Secondary | ICD-10-CM | POA: Diagnosis not present

## 2016-07-25 DIAGNOSIS — R918 Other nonspecific abnormal finding of lung field: Secondary | ICD-10-CM | POA: Diagnosis not present

## 2016-07-25 DIAGNOSIS — J310 Chronic rhinitis: Secondary | ICD-10-CM | POA: Diagnosis not present

## 2016-08-01 DIAGNOSIS — M791 Myalgia: Secondary | ICD-10-CM | POA: Diagnosis not present

## 2016-08-01 DIAGNOSIS — M4696 Unspecified inflammatory spondylopathy, lumbar region: Secondary | ICD-10-CM | POA: Diagnosis not present

## 2016-08-01 DIAGNOSIS — Z96651 Presence of right artificial knee joint: Secondary | ICD-10-CM | POA: Diagnosis not present

## 2016-08-01 DIAGNOSIS — F1120 Opioid dependence, uncomplicated: Secondary | ICD-10-CM | POA: Diagnosis not present

## 2016-08-01 DIAGNOSIS — M47816 Spondylosis without myelopathy or radiculopathy, lumbar region: Secondary | ICD-10-CM | POA: Diagnosis not present

## 2016-08-01 DIAGNOSIS — M5136 Other intervertebral disc degeneration, lumbar region: Secondary | ICD-10-CM | POA: Diagnosis not present

## 2016-08-01 DIAGNOSIS — G4730 Sleep apnea, unspecified: Secondary | ICD-10-CM | POA: Diagnosis not present

## 2016-08-01 DIAGNOSIS — G894 Chronic pain syndrome: Secondary | ICD-10-CM | POA: Diagnosis not present

## 2016-08-01 DIAGNOSIS — Z79891 Long term (current) use of opiate analgesic: Secondary | ICD-10-CM | POA: Diagnosis not present

## 2016-08-01 DIAGNOSIS — D500 Iron deficiency anemia secondary to blood loss (chronic): Secondary | ICD-10-CM | POA: Diagnosis not present

## 2016-08-02 DIAGNOSIS — M79645 Pain in left finger(s): Secondary | ICD-10-CM | POA: Diagnosis not present

## 2016-08-08 DIAGNOSIS — G4730 Sleep apnea, unspecified: Secondary | ICD-10-CM | POA: Diagnosis not present

## 2016-08-13 DIAGNOSIS — M25641 Stiffness of right hand, not elsewhere classified: Secondary | ICD-10-CM | POA: Diagnosis not present

## 2016-08-13 DIAGNOSIS — M180 Bilateral primary osteoarthritis of first carpometacarpal joints: Secondary | ICD-10-CM | POA: Diagnosis not present

## 2016-08-13 DIAGNOSIS — M79645 Pain in left finger(s): Secondary | ICD-10-CM | POA: Diagnosis not present

## 2016-08-13 DIAGNOSIS — M6281 Muscle weakness (generalized): Secondary | ICD-10-CM | POA: Diagnosis not present

## 2016-08-20 DIAGNOSIS — M79645 Pain in left finger(s): Secondary | ICD-10-CM | POA: Diagnosis not present

## 2016-08-20 DIAGNOSIS — M6281 Muscle weakness (generalized): Secondary | ICD-10-CM | POA: Diagnosis not present

## 2016-08-20 DIAGNOSIS — M25641 Stiffness of right hand, not elsewhere classified: Secondary | ICD-10-CM | POA: Diagnosis not present

## 2016-08-20 DIAGNOSIS — M180 Bilateral primary osteoarthritis of first carpometacarpal joints: Secondary | ICD-10-CM | POA: Diagnosis not present

## 2016-08-27 DIAGNOSIS — M180 Bilateral primary osteoarthritis of first carpometacarpal joints: Secondary | ICD-10-CM | POA: Diagnosis not present

## 2016-08-27 DIAGNOSIS — M25641 Stiffness of right hand, not elsewhere classified: Secondary | ICD-10-CM | POA: Diagnosis not present

## 2016-08-27 DIAGNOSIS — M79645 Pain in left finger(s): Secondary | ICD-10-CM | POA: Diagnosis not present

## 2016-08-27 DIAGNOSIS — M6281 Muscle weakness (generalized): Secondary | ICD-10-CM | POA: Diagnosis not present

## 2016-09-03 DIAGNOSIS — M4696 Unspecified inflammatory spondylopathy, lumbar region: Secondary | ICD-10-CM | POA: Diagnosis not present

## 2016-09-03 DIAGNOSIS — Z720 Tobacco use: Secondary | ICD-10-CM | POA: Diagnosis not present

## 2016-09-03 DIAGNOSIS — G4730 Sleep apnea, unspecified: Secondary | ICD-10-CM | POA: Diagnosis not present

## 2016-09-03 DIAGNOSIS — M179 Osteoarthritis of knee, unspecified: Secondary | ICD-10-CM | POA: Diagnosis not present

## 2016-09-03 DIAGNOSIS — Z79891 Long term (current) use of opiate analgesic: Secondary | ICD-10-CM | POA: Diagnosis not present

## 2016-09-03 DIAGNOSIS — M180 Bilateral primary osteoarthritis of first carpometacarpal joints: Secondary | ICD-10-CM | POA: Diagnosis not present

## 2016-09-03 DIAGNOSIS — F1120 Opioid dependence, uncomplicated: Secondary | ICD-10-CM | POA: Diagnosis not present

## 2016-09-03 DIAGNOSIS — M79645 Pain in left finger(s): Secondary | ICD-10-CM | POA: Diagnosis not present

## 2016-09-03 DIAGNOSIS — M791 Myalgia: Secondary | ICD-10-CM | POA: Diagnosis not present

## 2016-09-03 DIAGNOSIS — Z96651 Presence of right artificial knee joint: Secondary | ICD-10-CM | POA: Diagnosis not present

## 2016-09-03 DIAGNOSIS — G894 Chronic pain syndrome: Secondary | ICD-10-CM | POA: Diagnosis not present

## 2016-09-03 DIAGNOSIS — M5136 Other intervertebral disc degeneration, lumbar region: Secondary | ICD-10-CM | POA: Diagnosis not present

## 2016-09-03 DIAGNOSIS — M25641 Stiffness of right hand, not elsewhere classified: Secondary | ICD-10-CM | POA: Diagnosis not present

## 2016-09-03 DIAGNOSIS — M47816 Spondylosis without myelopathy or radiculopathy, lumbar region: Secondary | ICD-10-CM | POA: Diagnosis not present

## 2016-09-03 DIAGNOSIS — M6281 Muscle weakness (generalized): Secondary | ICD-10-CM | POA: Diagnosis not present

## 2016-09-10 DIAGNOSIS — M79645 Pain in left finger(s): Secondary | ICD-10-CM | POA: Diagnosis not present

## 2016-09-10 DIAGNOSIS — M25641 Stiffness of right hand, not elsewhere classified: Secondary | ICD-10-CM | POA: Diagnosis not present

## 2016-09-10 DIAGNOSIS — M180 Bilateral primary osteoarthritis of first carpometacarpal joints: Secondary | ICD-10-CM | POA: Diagnosis not present

## 2016-09-10 DIAGNOSIS — M6281 Muscle weakness (generalized): Secondary | ICD-10-CM | POA: Diagnosis not present

## 2016-09-13 DIAGNOSIS — G4730 Sleep apnea, unspecified: Secondary | ICD-10-CM | POA: Diagnosis not present

## 2016-09-13 DIAGNOSIS — M5136 Other intervertebral disc degeneration, lumbar region: Secondary | ICD-10-CM | POA: Diagnosis not present

## 2016-09-13 DIAGNOSIS — F1120 Opioid dependence, uncomplicated: Secondary | ICD-10-CM | POA: Diagnosis not present

## 2016-09-13 DIAGNOSIS — G894 Chronic pain syndrome: Secondary | ICD-10-CM | POA: Diagnosis not present

## 2016-09-13 DIAGNOSIS — M47816 Spondylosis without myelopathy or radiculopathy, lumbar region: Secondary | ICD-10-CM | POA: Diagnosis not present

## 2016-09-13 DIAGNOSIS — Z79891 Long term (current) use of opiate analgesic: Secondary | ICD-10-CM | POA: Diagnosis not present

## 2016-09-13 DIAGNOSIS — Z96651 Presence of right artificial knee joint: Secondary | ICD-10-CM | POA: Diagnosis not present

## 2016-09-13 DIAGNOSIS — M791 Myalgia: Secondary | ICD-10-CM | POA: Diagnosis not present

## 2016-09-13 DIAGNOSIS — M4696 Unspecified inflammatory spondylopathy, lumbar region: Secondary | ICD-10-CM | POA: Diagnosis not present

## 2016-09-17 DIAGNOSIS — M25641 Stiffness of right hand, not elsewhere classified: Secondary | ICD-10-CM | POA: Diagnosis not present

## 2016-09-17 DIAGNOSIS — M79645 Pain in left finger(s): Secondary | ICD-10-CM | POA: Diagnosis not present

## 2016-09-17 DIAGNOSIS — M180 Bilateral primary osteoarthritis of first carpometacarpal joints: Secondary | ICD-10-CM | POA: Diagnosis not present

## 2016-09-17 DIAGNOSIS — M6281 Muscle weakness (generalized): Secondary | ICD-10-CM | POA: Diagnosis not present

## 2016-09-20 DIAGNOSIS — Z1231 Encounter for screening mammogram for malignant neoplasm of breast: Secondary | ICD-10-CM | POA: Diagnosis not present

## 2016-09-27 DIAGNOSIS — D500 Iron deficiency anemia secondary to blood loss (chronic): Secondary | ICD-10-CM | POA: Diagnosis not present

## 2016-09-27 DIAGNOSIS — K589 Irritable bowel syndrome without diarrhea: Secondary | ICD-10-CM | POA: Diagnosis not present

## 2016-09-27 DIAGNOSIS — Z1212 Encounter for screening for malignant neoplasm of rectum: Secondary | ICD-10-CM | POA: Diagnosis not present

## 2016-09-27 DIAGNOSIS — K219 Gastro-esophageal reflux disease without esophagitis: Secondary | ICD-10-CM | POA: Diagnosis not present

## 2016-09-27 DIAGNOSIS — K2960 Other gastritis without bleeding: Secondary | ICD-10-CM | POA: Diagnosis not present

## 2016-10-01 DIAGNOSIS — F1120 Opioid dependence, uncomplicated: Secondary | ICD-10-CM | POA: Diagnosis not present

## 2016-10-01 DIAGNOSIS — Z79891 Long term (current) use of opiate analgesic: Secondary | ICD-10-CM | POA: Diagnosis not present

## 2016-10-01 DIAGNOSIS — G4730 Sleep apnea, unspecified: Secondary | ICD-10-CM | POA: Diagnosis not present

## 2016-10-01 DIAGNOSIS — M791 Myalgia: Secondary | ICD-10-CM | POA: Diagnosis not present

## 2016-10-01 DIAGNOSIS — M180 Bilateral primary osteoarthritis of first carpometacarpal joints: Secondary | ICD-10-CM | POA: Diagnosis not present

## 2016-10-01 DIAGNOSIS — M47816 Spondylosis without myelopathy or radiculopathy, lumbar region: Secondary | ICD-10-CM | POA: Diagnosis not present

## 2016-10-01 DIAGNOSIS — Z96651 Presence of right artificial knee joint: Secondary | ICD-10-CM | POA: Diagnosis not present

## 2016-10-01 DIAGNOSIS — G894 Chronic pain syndrome: Secondary | ICD-10-CM | POA: Diagnosis not present

## 2016-10-01 DIAGNOSIS — M6281 Muscle weakness (generalized): Secondary | ICD-10-CM | POA: Diagnosis not present

## 2016-10-01 DIAGNOSIS — M4696 Unspecified inflammatory spondylopathy, lumbar region: Secondary | ICD-10-CM | POA: Diagnosis not present

## 2016-10-01 DIAGNOSIS — M79645 Pain in left finger(s): Secondary | ICD-10-CM | POA: Diagnosis not present

## 2016-10-01 DIAGNOSIS — M25641 Stiffness of right hand, not elsewhere classified: Secondary | ICD-10-CM | POA: Diagnosis not present

## 2016-10-01 DIAGNOSIS — M5136 Other intervertebral disc degeneration, lumbar region: Secondary | ICD-10-CM | POA: Diagnosis not present

## 2016-10-08 DIAGNOSIS — M1712 Unilateral primary osteoarthritis, left knee: Secondary | ICD-10-CM | POA: Diagnosis not present

## 2016-10-08 DIAGNOSIS — G8929 Other chronic pain: Secondary | ICD-10-CM | POA: Diagnosis not present

## 2016-10-31 DIAGNOSIS — G4733 Obstructive sleep apnea (adult) (pediatric): Secondary | ICD-10-CM | POA: Diagnosis not present

## 2016-11-01 DIAGNOSIS — F1120 Opioid dependence, uncomplicated: Secondary | ICD-10-CM | POA: Diagnosis not present

## 2016-11-01 DIAGNOSIS — M47816 Spondylosis without myelopathy or radiculopathy, lumbar region: Secondary | ICD-10-CM | POA: Diagnosis not present

## 2016-11-01 DIAGNOSIS — M5136 Other intervertebral disc degeneration, lumbar region: Secondary | ICD-10-CM | POA: Diagnosis not present

## 2016-11-01 DIAGNOSIS — Z96651 Presence of right artificial knee joint: Secondary | ICD-10-CM | POA: Diagnosis not present

## 2016-11-01 DIAGNOSIS — G4730 Sleep apnea, unspecified: Secondary | ICD-10-CM | POA: Diagnosis not present

## 2016-11-01 DIAGNOSIS — Z79891 Long term (current) use of opiate analgesic: Secondary | ICD-10-CM | POA: Diagnosis not present

## 2016-11-01 DIAGNOSIS — G894 Chronic pain syndrome: Secondary | ICD-10-CM | POA: Diagnosis not present

## 2016-11-01 DIAGNOSIS — M179 Osteoarthritis of knee, unspecified: Secondary | ICD-10-CM | POA: Diagnosis not present

## 2016-11-01 DIAGNOSIS — M791 Myalgia: Secondary | ICD-10-CM | POA: Diagnosis not present

## 2016-11-07 DIAGNOSIS — J310 Chronic rhinitis: Secondary | ICD-10-CM | POA: Diagnosis not present

## 2016-11-07 DIAGNOSIS — R5383 Other fatigue: Secondary | ICD-10-CM | POA: Diagnosis not present

## 2016-11-07 DIAGNOSIS — G4733 Obstructive sleep apnea (adult) (pediatric): Secondary | ICD-10-CM | POA: Diagnosis not present

## 2016-11-07 DIAGNOSIS — J449 Chronic obstructive pulmonary disease, unspecified: Secondary | ICD-10-CM | POA: Diagnosis not present

## 2016-11-07 DIAGNOSIS — F17210 Nicotine dependence, cigarettes, uncomplicated: Secondary | ICD-10-CM | POA: Diagnosis not present

## 2016-11-07 DIAGNOSIS — R918 Other nonspecific abnormal finding of lung field: Secondary | ICD-10-CM | POA: Diagnosis not present

## 2016-11-15 DIAGNOSIS — G8929 Other chronic pain: Secondary | ICD-10-CM | POA: Diagnosis not present

## 2016-11-15 DIAGNOSIS — M1712 Unilateral primary osteoarthritis, left knee: Secondary | ICD-10-CM | POA: Diagnosis not present

## 2016-11-22 DIAGNOSIS — Z01818 Encounter for other preprocedural examination: Secondary | ICD-10-CM | POA: Diagnosis not present

## 2016-11-22 DIAGNOSIS — Z1389 Encounter for screening for other disorder: Secondary | ICD-10-CM | POA: Diagnosis not present

## 2016-11-22 DIAGNOSIS — Z0000 Encounter for general adult medical examination without abnormal findings: Secondary | ICD-10-CM | POA: Diagnosis not present

## 2016-11-22 DIAGNOSIS — E119 Type 2 diabetes mellitus without complications: Secondary | ICD-10-CM | POA: Diagnosis not present

## 2016-11-22 DIAGNOSIS — Z6829 Body mass index (BMI) 29.0-29.9, adult: Secondary | ICD-10-CM | POA: Diagnosis not present

## 2016-11-27 ENCOUNTER — Other Ambulatory Visit: Payer: Self-pay | Admitting: Orthopedic Surgery

## 2016-11-29 DIAGNOSIS — M4696 Unspecified inflammatory spondylopathy, lumbar region: Secondary | ICD-10-CM | POA: Diagnosis not present

## 2016-11-29 DIAGNOSIS — M5136 Other intervertebral disc degeneration, lumbar region: Secondary | ICD-10-CM | POA: Diagnosis not present

## 2016-11-29 DIAGNOSIS — G894 Chronic pain syndrome: Secondary | ICD-10-CM | POA: Diagnosis not present

## 2016-11-29 DIAGNOSIS — G4730 Sleep apnea, unspecified: Secondary | ICD-10-CM | POA: Diagnosis not present

## 2016-11-29 DIAGNOSIS — M47816 Spondylosis without myelopathy or radiculopathy, lumbar region: Secondary | ICD-10-CM | POA: Diagnosis not present

## 2016-11-29 DIAGNOSIS — Z96651 Presence of right artificial knee joint: Secondary | ICD-10-CM | POA: Diagnosis not present

## 2016-11-29 DIAGNOSIS — F1120 Opioid dependence, uncomplicated: Secondary | ICD-10-CM | POA: Diagnosis not present

## 2016-11-29 DIAGNOSIS — M179 Osteoarthritis of knee, unspecified: Secondary | ICD-10-CM | POA: Diagnosis not present

## 2016-11-29 DIAGNOSIS — Z79891 Long term (current) use of opiate analgesic: Secondary | ICD-10-CM | POA: Diagnosis not present

## 2016-11-29 DIAGNOSIS — M791 Myalgia: Secondary | ICD-10-CM | POA: Diagnosis not present

## 2016-12-05 DIAGNOSIS — G4733 Obstructive sleep apnea (adult) (pediatric): Secondary | ICD-10-CM | POA: Diagnosis not present

## 2016-12-12 ENCOUNTER — Other Ambulatory Visit (HOSPITAL_COMMUNITY): Payer: Self-pay

## 2016-12-12 NOTE — Pre-Procedure Instructions (Addendum)
Natasha Meyer  12/12/2016     No Pharmacies Listed   Your procedure is scheduled on Mon. June 4  Report to [facility] Admitting at 5:30 A.M.  Call this number if you have problems the morning of surgery:  [phone]   Remember:  Do not eat food or drink liquids after midnight on Sun. June 3   Take these medicines the morning of surgery with A SIP OF WATER : albuterol inhaler if needed-bring to hospital, symbicort-bring to hospital, zyrtec, dicyclomine, cymbalta, omeprazole, oxycodone if needed,lyrica, phenergan if needed, tizandine if needed             1 week prior to surgery stop advil, motrin, aleve, ibuprofen, vitamins/herbal medicines.                How to Manage Your Diabetes Before and After Surgery  Why is it important to control my blood sugar before and after surgery? . Improving blood sugar levels before and after surgery helps healing and can limit problems. . A way of improving blood sugar control is eating a healthy diet by: o  Eating less sugar and carbohydrates o  Increasing activity/exercise o  Talking with your doctor about reaching your blood sugar goals . High blood sugars (greater than 180 mg/dL) can raise your risk of infections and slow your recovery, so you will need to focus on controlling your diabetes during the weeks before surgery. . Make sure that the doctor who takes care of your diabetes knows about your planned surgery including the date and location.  How do I manage my blood sugar before surgery? . Check your blood sugar at least 4 times a day, starting 2 days before surgery, to make sure that the level is not too high or low. o Check your blood sugar the morning of your surgery when you wake up and every 2 hours until you get to the Short Stay unit. . If your blood sugar is less than 70 mg/dL, you will need to treat for low blood sugar: o Do not take insulin. o Treat a low blood sugar (less than 70 mg/dL) with  cup of clear  juice (cranberry or apple), 4 glucose tablets, OR glucose gel. o Recheck blood sugar in 15 minutes after treatment (to make sure it is greater than 70 mg/dL). If your blood sugar is not greater than 70 mg/dL on recheck, call [phone] for further instructions. . Report your blood sugar to the short stay nurse when you get to Short Stay.  . If you are admitted to the hospital after surgery: o Your blood sugar will be checked by the staff and you will probably be given insulin after surgery (instead of oral diabetes medicines) to make sure you have good blood sugar levels. o The goal for blood sugar control after surgery is 80-180 mg/dL.           Do not wear jewelry, make-up or nail polish.  Do not wear lotions, powders, or perfumes, or deoderant.  Do not shave 48 hours prior to surgery.  Men may shave face and neck.  Do not bring valuables to the hospital.  [facility] is not responsible for any belongings or valuables.  Contacts, dentures or bridgework may not be worn into surgery.  Leave your suitcase in the car.  After surgery it may be brought to your room.  For patients admitted to the hospital, discharge time will be determined by your treatment team.  Patients discharged the day of surgery will not be allowed to drive home.    Special instructions:  [facility]- Preparing For Surgery  Before surgery, you can play an important role. Because skin is not sterile, your skin needs to be as free of germs as possible. You can reduce the number of germs on your skin by washing with CHG (chlorahexidine gluconate) Soap before surgery.  CHG is an antiseptic cleaner which kills germs and bonds with the skin to continue killing germs even after washing.  Please do not use if you have an allergy to CHG or antibacterial soaps. If your skin becomes reddened/irritated stop using the CHG.  Do not shave (including legs and underarms) for at least 48 hours prior to first CHG shower. It is OK  to shave your face.  Please follow these instructions carefully.   1. Shower the NIGHT BEFORE SURGERY and the MORNING OF SURGERY with CHG.   2. If you chose to wash your hair, wash your hair first as usual with your normal shampoo.  3. After you shampoo, rinse your hair and body thoroughly to remove the shampoo.  4. Use CHG as you would any other liquid soap. You can apply CHG directly to the skin and wash gently with a scrungie or a clean washcloth.   5. Apply the CHG Soap to your body ONLY FROM THE NECK DOWN.  Do not use on open wounds or open sores. Avoid contact with your eyes, ears, mouth and genitals (private parts). Wash genitals (private parts) with your normal soap.  6. Wash thoroughly, paying special attention to the area where your surgery will be performed.  7. Thoroughly rinse your body with warm water from the neck down.  8. DO NOT shower/wash with your normal soap after using and rinsing off the CHG Soap.  9. Pat yourself dry with a CLEAN TOWEL.   10. Wear CLEAN PAJAMAS   11. Place CLEAN SHEETS on your bed the night of your first shower and DO NOT SLEEP WITH PETS.    Day of Surgery: Do not apply any deodorants/lotions. Please wear clean clothes to the hospital/surgery center.      Please read over the following fact sheets that you were given. Total Joint Packet, MRSA Information and Surgical Site Infection Prevention

## 2016-12-13 ENCOUNTER — Encounter (HOSPITAL_COMMUNITY): Payer: Self-pay | Admitting: *Deleted

## 2016-12-13 ENCOUNTER — Encounter (HOSPITAL_COMMUNITY)
Admission: RE | Admit: 2016-12-13 | Discharge: 2016-12-13 | Disposition: A | Discharge status: Home / Self Care | Payer: Medicare HMO | Source: Ambulatory Visit | Attending: Orthopedic Surgery | Admitting: Orthopedic Surgery

## 2016-12-13 DIAGNOSIS — Z01812 Encounter for preprocedural laboratory examination: Secondary | ICD-10-CM | POA: Insufficient documentation

## 2016-12-13 DIAGNOSIS — M25561 Pain in right knee: Secondary | ICD-10-CM | POA: Insufficient documentation

## 2016-12-13 HISTORY — DX: Type 2 diabetes mellitus without complications: E11.9

## 2016-12-13 HISTORY — DX: Major depressive disorder, single episode, unspecified: F32.9

## 2016-12-13 HISTORY — DX: Pneumonia, unspecified organism: J18.9

## 2016-12-13 HISTORY — DX: Unspecified osteoarthritis, unspecified site: M19.90

## 2016-12-13 HISTORY — DX: Essential (primary) hypertension: I10

## 2016-12-13 HISTORY — DX: Anemia, unspecified: D64.9

## 2016-12-13 HISTORY — DX: Gastro-esophageal reflux disease without esophagitis: K21.9

## 2016-12-13 HISTORY — DX: Depression, unspecified: F32.A

## 2016-12-13 HISTORY — DX: Unspecified asthma, uncomplicated: J45.909

## 2016-12-13 HISTORY — DX: Fibromyalgia: M79.7

## 2016-12-13 HISTORY — DX: Inflammatory liver disease, unspecified: K75.9

## 2016-12-13 HISTORY — DX: Sleep apnea, unspecified: G47.30

## 2016-12-13 LAB — COMPREHENSIVE METABOLIC PANEL
ALK PHOS: 63 U/L (ref 38–126)
ALT: 18 U/L (ref 14–54)
AST: 20 U/L (ref 15–41)
Albumin: 4.3 g/dL (ref 3.5–5.0)
Anion gap: 9 (ref 5–15)
BILIRUBIN TOTAL: 0.5 mg/dL (ref 0.3–1.2)
BUN: 7 mg/dL (ref 6–20)
CALCIUM: 9.4 mg/dL (ref 8.9–10.3)
CO2: 26 mmol/L (ref 22–32)
CREATININE: 0.62 mg/dL (ref 0.44–1.00)
Chloride: 102 mmol/L (ref 101–111)
GFR calc Af Amer: >60 mL/min (ref >60)
GFR calc non Af Amer: >60 mL/min (ref >60)
Glucose, Bld: 79 mg/dL (ref 65–99)
POTASSIUM: 3.5 mmol/L (ref 3.5–5.1)
Sodium: 137 mmol/L (ref 135–145)
TOTAL PROTEIN: 6.9 g/dL (ref 6.5–8.1)

## 2016-12-13 LAB — CBC WITH DIFFERENTIAL/PLATELET
BASOS ABS: 0.1 10*3/uL (ref 0.0–0.1)
Basophils Relative: 1 %
EOS ABS: 0.3 10*3/uL (ref 0.0–0.7)
EOS PCT: 3 %
HCT: 43.7 % (ref 36.0–46.0)
Hemoglobin: 14.6 g/dL (ref 12.0–15.0)
Lymphocytes Relative: 38 %
Lymphs Abs: 2.9 10*3/uL (ref 0.7–4.0)
MCH: 29.3 pg (ref 26.0–34.0)
MCHC: 33.4 g/dL (ref 30.0–36.0)
MCV: 87.6 fL (ref 78.0–100.0)
MONO ABS: 0.8 10*3/uL (ref 0.1–1.0)
Monocytes Relative: 11 %
Neutro Abs: 3.7 10*3/uL (ref 1.7–7.7)
Neutrophils Relative %: 47 %
PLATELETS: 313 10*3/uL (ref 150–400)
RBC: 4.99 MIL/uL (ref 3.87–5.11)
RDW: 14.1 % (ref 11.5–15.5)
WBC: 7.7 10*3/uL (ref 4.0–10.5)

## 2016-12-13 LAB — SURGICAL PCR SCREEN
MRSA, PCR: NEGATIVE
Staphylococcus aureus: NEGATIVE

## 2016-12-13 LAB — GLUCOSE, CAPILLARY: GLUCOSE-CAPILLARY: 88 mg/dL (ref 65–99)

## 2016-12-13 NOTE — Progress Notes (Signed)
PCP: Dr[facility] Leonor LivHolt @ [facility] in [place]--request hgb A1C/ekg/notes  Pulm: Dr. Alain Marionhrodi @ [facility]--Request sleep study/notes.  Fasting blood sugars: 100-140

## 2016-12-21 MED ORDER — GABAPENTIN 300 MG PO CAPS
300.0000 mg | ORAL_CAPSULE | ORAL | Status: AC
Start: 2016-12-24 — End: 2016-12-24
  Administered 2016-12-24: 300 mg via ORAL
  Filled 2016-12-21: qty 1

## 2016-12-21 MED ORDER — CEFAZOLIN SODIUM-DEXTROSE 2-4 GM/100ML-% IV SOLN
2.0000 g | INTRAVENOUS | Status: AC
  Administered 2016-12-24: 2 g via INTRAVENOUS
  Filled 2016-12-21: qty 100

## 2016-12-21 MED ORDER — DEXAMETHASONE SODIUM PHOSPHATE 10 MG/ML IJ SOLN
8.0000 mg | INTRAMUSCULAR | Status: AC
  Administered 2016-12-24: 8 mg via INTRAVENOUS
  Filled 2016-12-21: qty 1

## 2016-12-21 MED ORDER — ACETAMINOPHEN 500 MG PO TABS
1000.0000 mg | ORAL_TABLET | ORAL | Status: AC
  Administered 2016-12-24: 1000 mg via ORAL
  Filled 2016-12-21: qty 2

## 2016-12-21 MED ORDER — TRANEXAMIC ACID 1000 MG/10ML IV SOLN
1000.0000 mg | INTRAVENOUS | Status: AC
  Administered 2016-12-24: 1000 mg via INTRAVENOUS
  Filled 2016-12-21: qty 10

## 2016-12-21 MED ORDER — BUPIVACAINE LIPOSOME 1.3 % IJ SUSP
20.0000 mL | INTRAMUSCULAR | Status: AC
  Administered 2016-12-24: 20 mL
  Filled 2016-12-21: qty 20

## 2016-12-23 NOTE — H&P (Signed)
Natasha Meyer MRN:  [id number] DOB/SEX:  December 28, 1955/female  CHIEF COMPLAINT:  Painful left Knee  HISTORY: Patient is a 61 y.o. female presented with a history of pain in the left knee. Onset of symptoms was gradual starting a few years ago with gradually worsening course since that time. Patient has been treated conservatively with over-the-counter NSAIDs and activity modification. Patient currently rates pain in the knee at 10 out of 10 with activity. There is pain at night.  PAST MEDICAL HISTORY: There are no active problems to display for this patient.  Past Medical History:  Diagnosis Date  . Anemia   . Arthritis   . Asthma   . Depression   . Diabetes mellitus without complication (HCC)   . Fibromyalgia   . GERD (gastroesophageal reflux disease)   . Hepatitis    history of "C" ,took treatments no longer have it  . Hypertension   . Pneumonia    history  . Sleep apnea    Past Surgical History:  Procedure Laterality Date  . ABDOMINAL HYSTERECTOMY    . CARPAL TUNNEL RELEASE Bilateral 1990's  . CHOLECYSTECTOMY    . HAND TENDON SURGERY Left    graft  . JOINT REPLACEMENT Right 2009   knee     MEDICATIONS:   No prescriptions prior to admission.    ALLERGIES:   Allergies  Allergen Reactions  . Hylan G-F 20 Swelling    SWELLING REACTION UNSPECIFIED   . Glucosamine-Chondroitin Swelling    SWELLING REACTION UNSPECIFIED   . Prednisone Hypertension  . Acetaminophen     UNSPECIFIED REACTION   . Lisinopril     SWELLING REACTION UNSPECIFIED   . Sulfa Antibiotics     SWELLING REACTION UNSPECIFIED   . Albuterol Other (See Comments)    INTOLERANCE > TACHYCARDIA  . Latex Dermatitis and Rash  . Morphine And Related Other (See Comments)    hallucinations    REVIEW OF SYSTEMS:  A comprehensive review of systems was negative except for: Musculoskeletal: positive for arthralgias and stiff joints   FAMILY HISTORY:  No family history on file.  SOCIAL HISTORY:   Social  History  Substance Use Topics  . Smoking status: Current Every Day Smoker    Packs/day: 0.25    Years: 40.00    Types: Cigarettes  . Smokeless tobacco: Never Used  . Alcohol use No     EXAMINATION:  Vital signs in last 24 hours:    There were no vitals taken for this visit.  General Appearance:    Alert, cooperative, no distress, appears stated age  Head:    Normocephalic, without obvious abnormality, atraumatic  Eyes:    PERRL, conjunctiva/corneas clear, EOM's intact, fundi    benign, both eyes  Ears:    Normal TM's and external ear canals, both ears  Nose:   Nares normal, septum midline, mucosa normal, no drainage    or sinus tenderness  Throat:   Lips, mucosa, and tongue normal; teeth and gums normal  Neck:   Supple, symmetrical, trachea midline, no adenopathy;    thyroid:  no enlargement/tenderness/nodules; no carotid   bruit or JVD  Back:     Symmetric, no curvature, ROM normal, no CVA tenderness  Lungs:     Clear to auscultation bilaterally, respirations unlabored  Chest Wall:    No tenderness or deformity   Heart:    Regular rate and rhythm, S1 and S2 normal, no murmur, rub   or gallop  Breast Exam:  No tenderness, masses, or nipple abnormality  Abdomen:     Soft, non-tender, bowel sounds active all four quadrants,    no masses, no organomegaly  Genitalia:    Normal female without lesion, discharge or tenderness  Rectal:    Normal tone, no masses or tenderness;   guaiac negative stool  Extremities:   Extremities normal, atraumatic, no cyanosis or edema  Pulses:   2+ and symmetric all extremities  Skin:   Skin color, texture, turgor normal, no rashes or lesions  Lymph nodes:   Cervical, supraclavicular, and axillary nodes normal  Neurologic:   CNII-XII intact, normal strength, sensation and reflexes    throughout    Musculoskeletal:  ROM 0-120, Ligaments intact,  Imaging Review Plain radiographs demonstrate severe degenerative joint disease of the left knee. The  overall alignment is neutral. The bone quality appears to be excellent for age and reported activity level.  Assessment/Plan: Primary osteoarthritis, left knee   The patient history, physical examination and imaging studies are consistent with advanced degenerative joint disease of the left knee. The patient has failed conservative treatment.  The clearance notes were reviewed.  After discussion with the patient it was felt that Total Knee Replacement was indicated. The procedure,  risks, and benefits of total knee arthroplasty were presented and reviewed. The risks including but not limited to aseptic loosening, infection, blood clots, vascular injury, stiffness, patella tracking problems complications among others were discussed. The patient acknowledged the explanation, agreed to proceed with the plan.  Guy SandiferColby Alan Robbins 12/23/2016, 9:16 PM

## 2016-12-24 ENCOUNTER — Encounter (HOSPITAL_COMMUNITY)
Admission: RE | Disposition: A | Discharge status: Home / Self Care | Payer: Self-pay | Source: Ambulatory Visit | Attending: Orthopedic Surgery

## 2016-12-24 ENCOUNTER — Encounter (HOSPITAL_COMMUNITY): Payer: Self-pay | Admitting: Surgery

## 2016-12-24 ENCOUNTER — Observation Stay (HOSPITAL_COMMUNITY)
Admission: RE | Admit: 2016-12-24 | Discharge: 2016-12-25 | Disposition: A | Discharge status: Home / Self Care | Payer: Medicare HMO | Source: Ambulatory Visit | Attending: Orthopedic Surgery | Admitting: Orthopedic Surgery

## 2016-12-24 ENCOUNTER — Ambulatory Visit (HOSPITAL_COMMUNITY): Payer: Medicare HMO | Admitting: Certified Registered Nurse Anesthetist

## 2016-12-24 DIAGNOSIS — F329 Major depressive disorder, single episode, unspecified: Secondary | ICD-10-CM | POA: Insufficient documentation

## 2016-12-24 DIAGNOSIS — Z96659 Presence of unspecified artificial knee joint: Secondary | ICD-10-CM

## 2016-12-24 DIAGNOSIS — M797 Fibromyalgia: Secondary | ICD-10-CM | POA: Insufficient documentation

## 2016-12-24 DIAGNOSIS — I10 Essential (primary) hypertension: Secondary | ICD-10-CM | POA: Diagnosis not present

## 2016-12-24 DIAGNOSIS — Z79899 Other long term (current) drug therapy: Secondary | ICD-10-CM | POA: Diagnosis not present

## 2016-12-24 DIAGNOSIS — Z8619 Personal history of other infectious and parasitic diseases: Secondary | ICD-10-CM | POA: Diagnosis not present

## 2016-12-24 DIAGNOSIS — Z888 Allergy status to other drugs, medicaments and biological substances status: Secondary | ICD-10-CM | POA: Insufficient documentation

## 2016-12-24 DIAGNOSIS — K219 Gastro-esophageal reflux disease without esophagitis: Secondary | ICD-10-CM | POA: Diagnosis not present

## 2016-12-24 DIAGNOSIS — G4730 Sleep apnea, unspecified: Secondary | ICD-10-CM | POA: Diagnosis not present

## 2016-12-24 DIAGNOSIS — M21062 Valgus deformity, not elsewhere classified, left knee: Secondary | ICD-10-CM | POA: Insufficient documentation

## 2016-12-24 DIAGNOSIS — J45909 Unspecified asthma, uncomplicated: Secondary | ICD-10-CM | POA: Diagnosis not present

## 2016-12-24 DIAGNOSIS — G8918 Other acute postprocedural pain: Secondary | ICD-10-CM | POA: Diagnosis not present

## 2016-12-24 DIAGNOSIS — M1712 Unilateral primary osteoarthritis, left knee: Principal | ICD-10-CM | POA: Insufficient documentation

## 2016-12-24 DIAGNOSIS — Z885 Allergy status to narcotic agent status: Secondary | ICD-10-CM | POA: Insufficient documentation

## 2016-12-24 DIAGNOSIS — M25762 Osteophyte, left knee: Secondary | ICD-10-CM | POA: Diagnosis not present

## 2016-12-24 DIAGNOSIS — Z91040 Latex allergy status: Secondary | ICD-10-CM | POA: Insufficient documentation

## 2016-12-24 DIAGNOSIS — Z882 Allergy status to sulfonamides status: Secondary | ICD-10-CM | POA: Diagnosis not present

## 2016-12-24 DIAGNOSIS — F17210 Nicotine dependence, cigarettes, uncomplicated: Secondary | ICD-10-CM | POA: Diagnosis not present

## 2016-12-24 DIAGNOSIS — E119 Type 2 diabetes mellitus without complications: Secondary | ICD-10-CM | POA: Diagnosis not present

## 2016-12-24 HISTORY — PX: TOTAL KNEE ARTHROPLASTY: SHX125

## 2016-12-24 HISTORY — DX: Presence of unspecified artificial knee joint: Z96.659

## 2016-12-24 LAB — GLUCOSE, CAPILLARY
Glucose-Capillary: 116 mg/dL — ABNORMAL HIGH (ref 65–99)
Glucose-Capillary: 74 mg/dL (ref 65–99)

## 2016-12-24 SURGERY — ARTHROPLASTY, KNEE, TOTAL
Anesthesia: Spinal | Site: Knee | Laterality: Left

## 2016-12-24 MED ORDER — PREGABALIN 75 MG PO CAPS
150.0000 mg | ORAL_CAPSULE | Freq: Three times a day (TID) | ORAL | Status: DC
  Administered 2016-12-24 – 2016-12-25 (×4): 150 mg via ORAL
  Filled 2016-12-24 (×4): qty 2

## 2016-12-24 MED ORDER — METOCLOPRAMIDE HCL 5 MG PO TABS: 5.0000 mg | ORAL_TABLET | Freq: Three times a day (TID) | ORAL | Status: DC | PRN

## 2016-12-24 MED ORDER — BUPIVACAINE HCL (PF) 0.25 % IJ SOLN
INTRAMUSCULAR | Status: AC
  Filled 2016-12-24: qty 30

## 2016-12-24 MED ORDER — DIPHENHYDRAMINE HCL 12.5 MG/5ML PO ELIX: 12.5000 mg | ORAL_SOLUTION | ORAL | Status: DC | PRN

## 2016-12-24 MED ORDER — MENTHOL 3 MG MT LOZG: 1.0000 | LOZENGE | OROMUCOSAL | Status: DC | PRN

## 2016-12-24 MED ORDER — PROPOFOL 10 MG/ML IV BOLUS
INTRAVENOUS | Status: AC
  Filled 2016-12-24: qty 20

## 2016-12-24 MED ORDER — BUPIVACAINE HCL (PF) 0.75 % IJ SOLN
INTRAMUSCULAR | Status: DC | PRN
  Administered 2016-12-24: 1.5 mL

## 2016-12-24 MED ORDER — HYDROCODONE-ACETAMINOPHEN 7.5-325 MG PO TABS
1.0000 | ORAL_TABLET | Freq: Four times a day (QID) | ORAL | Status: DC
  Administered 2016-12-24 – 2016-12-25 (×5): 1 via ORAL
  Filled 2016-12-24 (×5): qty 1

## 2016-12-24 MED ORDER — BUPIVACAINE-EPINEPHRINE (PF) 0.5% -1:200000 IJ SOLN
INTRAMUSCULAR | Status: DC | PRN
  Administered 2016-12-24: 25 mL via PERINEURAL

## 2016-12-24 MED ORDER — SODIUM CHLORIDE 0.9 % IR SOLN
Status: DC | PRN
  Administered 2016-12-24: 3000 mL

## 2016-12-24 MED ORDER — AMLODIPINE BESYLATE 5 MG PO TABS
5.0000 mg | ORAL_TABLET | Freq: Every day | ORAL | Status: DC
  Administered 2016-12-24: 5 mg via ORAL
  Filled 2016-12-24 (×2): qty 1

## 2016-12-24 MED ORDER — ROCURONIUM BROMIDE 10 MG/ML (PF) SYRINGE
PREFILLED_SYRINGE | INTRAVENOUS | Status: AC
  Filled 2016-12-24: qty 5

## 2016-12-24 MED ORDER — CHLORHEXIDINE GLUCONATE 4 % EX LIQD: 60.0000 mL | Freq: Once | CUTANEOUS | Status: DC

## 2016-12-24 MED ORDER — ACETAMINOPHEN 650 MG RE SUPP: 650.0000 mg | Freq: Four times a day (QID) | RECTAL | Status: DC | PRN

## 2016-12-24 MED ORDER — PROPOFOL 10 MG/ML IV BOLUS
INTRAVENOUS | Status: DC | PRN
  Administered 2016-12-24: 20 mg via INTRAVENOUS

## 2016-12-24 MED ORDER — METOCLOPRAMIDE HCL 5 MG/ML IJ SOLN: 5.0000 mg | Freq: Three times a day (TID) | INTRAMUSCULAR | Status: DC | PRN

## 2016-12-24 MED ORDER — KETOROLAC TROMETHAMINE 15 MG/ML IJ SOLN
15.0000 mg | Freq: Four times a day (QID) | INTRAMUSCULAR | Status: AC
  Administered 2016-12-24 – 2016-12-25 (×4): 15 mg via INTRAVENOUS
  Filled 2016-12-24 (×4): qty 1

## 2016-12-24 MED ORDER — CEFAZOLIN SODIUM-DEXTROSE 1-4 GM/50ML-% IV SOLN
1.0000 g | Freq: Four times a day (QID) | INTRAVENOUS | Status: AC
  Administered 2016-12-24 (×2): 1 g via INTRAVENOUS
  Filled 2016-12-24 (×2): qty 50

## 2016-12-24 MED ORDER — FLEET ENEMA 7-19 GM/118ML RE ENEM: 1.0000 | ENEMA | Freq: Once | RECTAL | Status: DC | PRN

## 2016-12-24 MED ORDER — ALBUTEROL SULFATE (2.5 MG/3ML) 0.083% IN NEBU: 3.0000 mL | INHALATION_SOLUTION | Freq: Four times a day (QID) | RESPIRATORY_TRACT | Status: DC | PRN

## 2016-12-24 MED ORDER — METHOCARBAMOL 500 MG PO TABS
500.0000 mg | ORAL_TABLET | Freq: Four times a day (QID) | ORAL | Status: DC | PRN
  Administered 2016-12-24 – 2016-12-25 (×4): 500 mg via ORAL
  Filled 2016-12-24 (×4): qty 1

## 2016-12-24 MED ORDER — DULOXETINE HCL 60 MG PO CPEP
60.0000 mg | ORAL_CAPSULE | Freq: Two times a day (BID) | ORAL | Status: DC
  Administered 2016-12-24 – 2016-12-25 (×2): 60 mg via ORAL
  Filled 2016-12-24 (×2): qty 1

## 2016-12-24 MED ORDER — DOCUSATE SODIUM 100 MG PO CAPS
100.0000 mg | ORAL_CAPSULE | Freq: Two times a day (BID) | ORAL | Status: DC
  Administered 2016-12-24: 100 mg via ORAL
  Filled 2016-12-24 (×3): qty 1

## 2016-12-24 MED ORDER — ACETAMINOPHEN 325 MG PO TABS: 650.0000 mg | ORAL_TABLET | Freq: Four times a day (QID) | ORAL | Status: DC | PRN

## 2016-12-24 MED ORDER — SENNOSIDES-DOCUSATE SODIUM 8.6-50 MG PO TABS: 1.0000 | ORAL_TABLET | Freq: Every evening | ORAL | Status: DC | PRN

## 2016-12-24 MED ORDER — MOMETASONE FURO-FORMOTEROL FUM 200-5 MCG/ACT IN AERO
2.0000 | INHALATION_SPRAY | Freq: Two times a day (BID) | RESPIRATORY_TRACT | Status: DC
  Filled 2016-12-24: qty 8.8

## 2016-12-24 MED ORDER — HYDROMORPHONE HCL 1 MG/ML IJ SOLN: 0.2500 mg | INTRAMUSCULAR | Status: DC | PRN

## 2016-12-24 MED ORDER — PHENYLEPHRINE 40 MCG/ML (10ML) SYRINGE FOR IV PUSH (FOR BLOOD PRESSURE SUPPORT)
PREFILLED_SYRINGE | INTRAVENOUS | Status: AC
  Filled 2016-12-24: qty 10

## 2016-12-24 MED ORDER — PROMETHAZINE HCL 25 MG/ML IJ SOLN: 6.2500 mg | INTRAMUSCULAR | Status: DC | PRN

## 2016-12-24 MED ORDER — TRANEXAMIC ACID 1000 MG/10ML IV SOLN
1000.0000 mg | Freq: Once | INTRAVENOUS | Status: AC
  Administered 2016-12-24: 1000 mg via INTRAVENOUS
  Filled 2016-12-24: qty 10

## 2016-12-24 MED ORDER — EPHEDRINE 5 MG/ML INJ
INTRAVENOUS | Status: AC
  Filled 2016-12-24: qty 10

## 2016-12-24 MED ORDER — LIDOCAINE 2% (20 MG/ML) 5 ML SYRINGE
INTRAMUSCULAR | Status: AC
  Filled 2016-12-24: qty 5

## 2016-12-24 MED ORDER — EPINEPHRINE PF 1 MG/ML IJ SOLN
INTRAMUSCULAR | Status: AC
  Filled 2016-12-24: qty 1

## 2016-12-24 MED ORDER — ONDANSETRON HCL 4 MG PO TABS: 4.0000 mg | ORAL_TABLET | Freq: Four times a day (QID) | ORAL | Status: DC | PRN

## 2016-12-24 MED ORDER — FENTANYL CITRATE (PF) 100 MCG/2ML IJ SOLN
INTRAMUSCULAR | Status: DC | PRN
  Administered 2016-12-24 (×2): 50 ug via INTRAVENOUS

## 2016-12-24 MED ORDER — BUPIVACAINE-EPINEPHRINE (PF) 0.25% -1:200000 IJ SOLN
INTRAMUSCULAR | Status: DC | PRN
  Administered 2016-12-24: 30 mL

## 2016-12-24 MED ORDER — ALUM & MAG HYDROXIDE-SIMETH 200-200-20 MG/5ML PO SUSP
30.0000 mL | ORAL | Status: DC | PRN
Start: 2016-12-24 — End: 2016-12-25

## 2016-12-24 MED ORDER — HYDROMORPHONE HCL 1 MG/ML IJ SOLN
1.0000 mg | INTRAMUSCULAR | Status: DC | PRN
  Administered 2016-12-24 – 2016-12-25 (×5): 1 mg via INTRAVENOUS
  Filled 2016-12-24 (×5): qty 1

## 2016-12-24 MED ORDER — PROPOFOL 500 MG/50ML IV EMUL
INTRAVENOUS | Status: DC | PRN
  Administered 2016-12-24: 50 ug/kg/min via INTRAVENOUS

## 2016-12-24 MED ORDER — FENTANYL CITRATE (PF) 250 MCG/5ML IJ SOLN
INTRAMUSCULAR | Status: AC
  Filled 2016-12-24: qty 5

## 2016-12-24 MED ORDER — ONDANSETRON HCL 4 MG/2ML IJ SOLN
INTRAMUSCULAR | Status: AC
  Filled 2016-12-24: qty 4

## 2016-12-24 MED ORDER — SODIUM CHLORIDE 0.9 % IJ SOLN
INTRAMUSCULAR | Status: DC | PRN
  Administered 2016-12-24: 20 mL

## 2016-12-24 MED ORDER — PANTOPRAZOLE SODIUM 40 MG PO TBEC
40.0000 mg | DELAYED_RELEASE_TABLET | Freq: Every day | ORAL | Status: DC
  Administered 2016-12-25: 40 mg via ORAL
  Filled 2016-12-24: qty 1

## 2016-12-24 MED ORDER — BISACODYL 5 MG PO TBEC: 5.0000 mg | DELAYED_RELEASE_TABLET | Freq: Every day | ORAL | Status: DC | PRN

## 2016-12-24 MED ORDER — AMLODIPINE BESYLATE-VALSARTAN 5-160 MG PO TABS: 1.0000 | ORAL_TABLET | Freq: Every day | ORAL | Status: DC

## 2016-12-24 MED ORDER — ONDANSETRON HCL 4 MG/2ML IJ SOLN
4.0000 mg | Freq: Four times a day (QID) | INTRAMUSCULAR | Status: DC | PRN
  Administered 2016-12-24: 4 mg via INTRAVENOUS
  Filled 2016-12-24: qty 2

## 2016-12-24 MED ORDER — LACTATED RINGERS IV SOLN
INTRAVENOUS | Status: DC | PRN
  Administered 2016-12-24 (×2): via INTRAVENOUS

## 2016-12-24 MED ORDER — DEXTROSE 5 % IV SOLN
500.0000 mg | Freq: Four times a day (QID) | INTRAVENOUS | Status: DC | PRN
  Filled 2016-12-24: qty 5

## 2016-12-24 MED ORDER — PHENOL 1.4 % MT LIQD: 1.0000 | OROMUCOSAL | Status: DC | PRN

## 2016-12-24 MED ORDER — IRBESARTAN 150 MG PO TABS
150.0000 mg | ORAL_TABLET | Freq: Every day | ORAL | Status: DC
  Administered 2016-12-24 – 2016-12-25 (×2): 150 mg via ORAL
  Filled 2016-12-24 (×2): qty 1

## 2016-12-24 MED ORDER — MIDAZOLAM HCL 5 MG/5ML IJ SOLN
INTRAMUSCULAR | Status: DC | PRN
  Administered 2016-12-24 (×2): 1 mg via INTRAVENOUS

## 2016-12-24 MED ORDER — DEXAMETHASONE SODIUM PHOSPHATE 10 MG/ML IJ SOLN
10.0000 mg | Freq: Once | INTRAMUSCULAR | Status: AC
  Administered 2016-12-25: 10 mg via INTRAVENOUS
  Filled 2016-12-24: qty 1

## 2016-12-24 MED ORDER — ZOLPIDEM TARTRATE 5 MG PO TABS
5.0000 mg | ORAL_TABLET | Freq: Every evening | ORAL | Status: DC | PRN
  Administered 2016-12-25: 5 mg via ORAL
  Filled 2016-12-24: qty 1

## 2016-12-24 MED ORDER — OXYCODONE HCL 5 MG PO TABS
5.0000 mg | ORAL_TABLET | ORAL | Status: DC | PRN
  Administered 2016-12-24 – 2016-12-25 (×4): 10 mg via ORAL
  Filled 2016-12-24 (×4): qty 2

## 2016-12-24 MED ORDER — PROMETHAZINE HCL 25 MG PO TABS: 25.0000 mg | ORAL_TABLET | Freq: Two times a day (BID) | ORAL | Status: DC | PRN

## 2016-12-24 MED ORDER — NORTRIPTYLINE HCL 25 MG PO CAPS
25.0000 mg | ORAL_CAPSULE | Freq: Every day | ORAL | Status: DC
  Administered 2016-12-24: 25 mg via ORAL
  Filled 2016-12-24 (×2): qty 1

## 2016-12-24 MED ORDER — ONDANSETRON HCL 4 MG/2ML IJ SOLN
INTRAMUSCULAR | Status: DC | PRN
Start: 2016-12-24 — End: 2016-12-24
  Administered 2016-12-24: 4 mg via INTRAVENOUS

## 2016-12-24 MED ORDER — ASPIRIN EC 325 MG PO TBEC
325.0000 mg | DELAYED_RELEASE_TABLET | Freq: Two times a day (BID) | ORAL | Status: DC
  Administered 2016-12-24 – 2016-12-25 (×3): 325 mg via ORAL
  Filled 2016-12-24 (×3): qty 1

## 2016-12-24 MED ORDER — MIDAZOLAM HCL 2 MG/2ML IJ SOLN
INTRAMUSCULAR | Status: AC
  Filled 2016-12-24: qty 2

## 2016-12-24 MED ORDER — ESTRADIOL 1 MG PO TABS
1.0000 mg | ORAL_TABLET | Freq: Every day | ORAL | Status: DC
  Administered 2016-12-24: 1 mg via ORAL
  Filled 2016-12-24 (×3): qty 1

## 2016-12-24 MED ORDER — PHENYLEPHRINE HCL 10 MG/ML IJ SOLN
INTRAMUSCULAR | Status: DC | PRN
  Administered 2016-12-24 (×2): 80 ug via INTRAVENOUS

## 2016-12-24 SURGICAL SUPPLY — 66 items
BANDAGE ACE 6X5 VEL STRL LF (GAUZE/BANDAGES/DRESSINGS) ×3 IMPLANT
BANDAGE ESMARK 6X9 LF (GAUZE/BANDAGES/DRESSINGS) ×1 IMPLANT
BLADE REAMER PATELLA SZ29 (BLADE) ×2 IMPLANT
BLADE SAGITTAL 13X1.27X60 (BLADE) ×2 IMPLANT
BLADE SAGITTAL 13X1.27X60MM (BLADE) ×1
BLADE SAW SGTL 83.5X18.5 (BLADE) ×3 IMPLANT
BLADE SURG 10 STRL SS (BLADE) ×3 IMPLANT
BNDG CMPR 9X6 STRL LF SNTH (GAUZE/BANDAGES/DRESSINGS) ×1
BNDG ESMARK 6X9 LF (GAUZE/BANDAGES/DRESSINGS) ×3
BOWL SMART MIX CTS (DISPOSABLE) ×3 IMPLANT
CAPT KNEE TOTAL 3 ×3 IMPLANT
CEMENT BONE SIMPLEX SPEEDSET (Cement) ×6 IMPLANT
CLOSURE STERI-STRIP 1/2X4 (GAUZE/BANDAGES/DRESSINGS) ×1
CLOSURE WOUND 1/2 X4 (GAUZE/BANDAGES/DRESSINGS) ×1
CLSR STERI-STRIP ANTIMIC 1/2X4 (GAUZE/BANDAGES/DRESSINGS) ×1 IMPLANT
COVER SURGICAL LIGHT HANDLE (MISCELLANEOUS) ×3 IMPLANT
CUFF TOURNIQUET SINGLE 34IN LL (TOURNIQUET CUFF) ×3 IMPLANT
DRAPE EXTREMITY T 121X128X90 (DRAPE) ×3 IMPLANT
DRAPE HALF SHEET 40X57 (DRAPES) ×3 IMPLANT
DRAPE INCISE IOBAN 66X45 STRL (DRAPES) ×6 IMPLANT
DRAPE U-SHAPE 47X51 STRL (DRAPES) ×3 IMPLANT
DRSG AQUACEL AG ADV 3.5X10 (GAUZE/BANDAGES/DRESSINGS) ×3 IMPLANT
DRSG AQUACEL AG ADV 3.5X14 (GAUZE/BANDAGES/DRESSINGS) ×2 IMPLANT
DURAPREP 26ML APPLICATOR (WOUND CARE) ×6 IMPLANT
ELECT REM PT RETURN 9FT ADLT (ELECTROSURGICAL) ×3
ELECTRODE REM PT RTRN 9FT ADLT (ELECTROSURGICAL) ×1 IMPLANT
FILTER STRAW FLUID ASPIR (MISCELLANEOUS) ×2 IMPLANT
GLOVE BIOGEL M 7.0 STRL (GLOVE) IMPLANT
GLOVE BIOGEL PI IND STRL 7.5 (GLOVE) IMPLANT
GLOVE BIOGEL PI IND STRL 8.5 (GLOVE) ×1 IMPLANT
GLOVE BIOGEL PI INDICATOR 7.5 (GLOVE)
GLOVE BIOGEL PI INDICATOR 8.5 (GLOVE) ×2
GLOVE SURG ORTHO 8.0 STRL STRW (GLOVE) ×6 IMPLANT
GOWN STRL REUS W/ TWL LRG LVL3 (GOWN DISPOSABLE) ×1 IMPLANT
GOWN STRL REUS W/ TWL XL LVL3 (GOWN DISPOSABLE) ×2 IMPLANT
GOWN STRL REUS W/TWL 2XL LVL3 (GOWN DISPOSABLE) ×3 IMPLANT
GOWN STRL REUS W/TWL LRG LVL3 (GOWN DISPOSABLE) ×3
GOWN STRL REUS W/TWL XL LVL3 (GOWN DISPOSABLE) ×6
HANDPIECE INTERPULSE COAX TIP (DISPOSABLE) ×3
HOOD PEEL AWAY FACE SHEILD DIS (HOOD) ×9 IMPLANT
KIT BASIN OR (CUSTOM PROCEDURE TRAY) ×3 IMPLANT
KIT ROOM TURNOVER OR (KITS) ×3 IMPLANT
KNEE CAPITATED TOTAL 3 IMPLANT
MANIFOLD NEPTUNE II (INSTRUMENTS) ×3 IMPLANT
NDL 18GX1X1/2 (RX/OR ONLY) (NEEDLE) IMPLANT
NEEDLE 18GX1X1/2 (RX/OR ONLY) (NEEDLE) ×3 IMPLANT
NEEDLE 22X1 1/2 (OR ONLY) (NEEDLE) ×6 IMPLANT
NS IRRIG 1000ML POUR BTL (IV SOLUTION) ×3 IMPLANT
PACK TOTAL JOINT (CUSTOM PROCEDURE TRAY) ×3 IMPLANT
PAD ARMBOARD 7.5X6 YLW CONV (MISCELLANEOUS) ×6 IMPLANT
SET HNDPC FAN SPRY TIP SCT (DISPOSABLE) ×1 IMPLANT
STRIP CLOSURE SKIN 1/2X4 (GAUZE/BANDAGES/DRESSINGS) ×2 IMPLANT
SUCTION FRAZIER HANDLE 10FR (MISCELLANEOUS)
SUCTION TUBE FRAZIER 10FR DISP (MISCELLANEOUS) IMPLANT
SUT MNCRL AB 3-0 PS2 18 (SUTURE) ×3 IMPLANT
SUT VIC AB 0 CTB1 27 (SUTURE) ×6 IMPLANT
SUT VIC AB 1 CT1 27 (SUTURE) ×6
SUT VIC AB 1 CT1 27XBRD ANBCTR (SUTURE) ×2 IMPLANT
SUT VIC AB 2-0 CT1 27 (SUTURE) ×6
SUT VIC AB 2-0 CT1 TAPERPNT 27 (SUTURE) ×2 IMPLANT
SYR 20CC LL (SYRINGE) ×6 IMPLANT
SYR TB 1ML LUER SLIP (SYRINGE) ×2 IMPLANT
TOWEL OR 17X24 6PK STRL BLUE (TOWEL DISPOSABLE) ×3 IMPLANT
TOWEL OR 17X26 10 PK STRL BLUE (TOWEL DISPOSABLE) ×3 IMPLANT
TRAY CATH 16FR W/PLASTIC CATH (SET/KITS/TRAYS/PACK) IMPLANT
WRAP KNEE MAXI GEL POST OP (GAUZE/BANDAGES/DRESSINGS) ×3 IMPLANT

## 2016-12-24 NOTE — Anesthesia Preprocedure Evaluation (Addendum)
Anesthesia Evaluation  Patient identified by MRN, date of birth, ID band Patient awake    Reviewed: Allergy & Precautions, NPO status , Patient's Chart, lab work & pertinent test results  Airway Mallampati: II   Neck ROM: Full    Dental  (+) Edentulous Upper, Edentulous Lower   Pulmonary asthma , sleep apnea , Current Smoker,    breath sounds clear to auscultation       Cardiovascular hypertension,  Rhythm:Regular Rate:Normal     Neuro/Psych    GI/Hepatic GERD  ,(+) Hepatitis -  Endo/Other  diabetes  Renal/GU      Musculoskeletal   Abdominal   Peds  Hematology   Anesthesia Other Findings   Reproductive/Obstetrics                           Anesthesia Physical Anesthesia Plan  ASA: III  Anesthesia Plan: Spinal   Post-op Pain Management:  Regional for Post-op pain   Induction: Intravenous  Airway Management Planned: Natural Airway  Additional Equipment:   Intra-op Plan:   Post-operative Plan:   Informed Consent: I have reviewed the patients History and Physical, chart, labs and discussed the procedure including the risks, benefits and alternatives for the proposed anesthesia with the patient or authorized representative who has indicated his/her understanding and acceptance.   Dental advisory given  Plan Discussed with: Anesthesiologist  Anesthesia Plan Comments:        Anesthesia Quick Evaluation

## 2016-12-24 NOTE — Evaluation (Addendum)
Occupational Therapy Evaluation/Discharge Patient Details Name: Natasha Meyer MRN: [id number] DOB: 1956-01-26 Today's Date: 12/24/2016    History of Present Illness Pt is a 61 y/o female s/p elective L TKA secondary to L knee OA. PMH includes DM, asthma, HTN, sleep apnea, fribromyalgia, and R TKA.    Clinical Impression   PTA, pt was independent with cane for ADL and functional mobility. She currently requires supervision for LB ADL, toilet transfers, and tub transfers. Education completed concerning knee precautions during ADL, dressing techniques, no pillow under knee, use of ice for pain management, and fall prevention techniques. She reports that she will have assistance from her mother for a few days. Pt demonstrates good understanding of all education topics and reports no further questions/concerns. No further acute OT needs identified. OT will sign off.     Follow Up Recommendations  No OT follow up;Supervision/Assistance - 24 hour (initial 24 hour assistance)    Equipment Recommendations  None recommended by OT (Pt has all needs met)    Recommendations for Other Services       Precautions / Restrictions Precautions Precautions: Knee Precaution Booklet Issued: No Precaution Comments: Reviewed knee precautions during ADL. Restrictions Weight Bearing Restrictions: Yes LLE Weight Bearing: Weight bearing as tolerated      Mobility Bed Mobility Overal bed mobility: Needs Assistance Bed Mobility: Supine to Sit     Supine to sit: Min guard;HOB elevated     General bed mobility comments: OOB in chair on arrival  Transfers Overall transfer level: Needs assistance Equipment used: Rolling walker (2 wheeled) Transfers: Sit to/from Stand Sit to Stand: Supervision         General transfer comment: Supervision for safety. VC's to slow down.    Balance Overall balance assessment: Needs assistance Sitting-balance support: No upper extremity supported;Feet  supported Sitting balance-Leahy Scale: Good     Standing balance support: Bilateral upper extremity supported;No upper extremity supported;During functional activity Standing balance-Leahy Scale: Fair Standing balance comment: Able to statically stand without UE support for grooming tasks at sink.                            ADL either performed or assessed with clinical judgement   ADL Overall ADL's : Needs assistance/impaired Eating/Feeding: Set up;Sitting   Grooming: Supervision/safety;Standing   Upper Body Bathing: Set up;Sitting   Lower Body Bathing: Supervison/ safety;Sit to/from stand   Upper Body Dressing : Set up;Sitting   Lower Body Dressing: Supervision/safety;Sit to/from stand   Toilet Transfer: Supervision/safety;Ambulation;RW;Comfort height toilet   Toileting- Clothing Manipulation and Hygiene: Supervision/safety;Sit to/from stand   Tub/ Shower Transfer: Supervision/safety;Tub transfer;Ambulation;3 in 1;Rolling walker   Functional mobility during ADLs: Supervision/safety;Rolling walker General ADL Comments: Pt educated on dressing techniques and knee precautions during ADL. Additionally educated pt concerning fall prevention strategies and use of ice for pain management.      Vision Patient Visual Report: No change from baseline Vision Assessment?: No apparent visual deficits     Perception     Praxis      Pertinent Vitals/Pain Pain Assessment: 0-10 Pain Score: 7  Pain Location: L knee Pain Descriptors / Indicators: Constant;Aching;Operative site guarding Pain Intervention(s): Limited activity within patient's tolerance;Monitored during session;Repositioned     Hand Dominance Right   Extremity/Trunk Assessment Upper Extremity Assessment Upper Extremity Assessment: Overall WFL for tasks assessed   Lower Extremity Assessment Lower Extremity Assessment: LLE deficits/detail LLE Deficits / Details: Decreased strength and ROM as expected  post-operatively.    Cervical / Trunk Assessment Cervical / Trunk Assessment: Normal   Communication Communication Communication: No difficulties   Cognition Arousal/Alertness: Awake/alert Behavior During Therapy: WFL for tasks assessed/performed Overall Cognitive Status: Within Functional Limits for tasks assessed                                     General Comments          Shoulder Instructions      Home Living Family/patient expects to be discharged to:: Private residence Living Arrangements: Alone Available Help at Discharge: Family;Available 24 hours/day (mother is coming to stay) Type of Home: House Home Access: Ramped entrance     Home Layout: One level     Bathroom Shower/Tub: Teacher, early years/pre: Standard     Home Equipment: Environmental consultant - 2 wheels;Bedside commode;Shower seat;Other (comment);Cane - single point (CPM)          Prior Functioning/Environment Level of Independence: Independent with assistive device(s)        Comments: Used cane for ambulation         OT Problem List: Decreased strength;Decreased range of motion;Decreased activity tolerance;Impaired balance (sitting and/or standing);Pain;Decreased knowledge of precautions      OT Treatment/Interventions:      OT Goals(Current goals can be found in the care plan section) Acute Rehab OT Goals Patient Stated Goal: to decrease pain  OT Goal Formulation: With patient Time For Goal Achievement: 01/07/17 Potential to Achieve Goals: Good  OT Frequency:     Barriers to D/C:            Co-evaluation              AM-PAC PT "6 Clicks" Daily Activity     Outcome Measure Help from another person eating meals?: None Help from another person taking care of personal grooming?: A Little Help from another person toileting, which includes using toliet, bedpan, or urinal?: A Little Help from another person bathing (including washing, rinsing, drying)?: A  Little Help from another person to put on and taking off regular upper body clothing?: None Help from another person to put on and taking off regular lower body clothing?: A Little 6 Click Score: 20   End of Session Equipment Utilized During Treatment: Gait belt;Rolling walker CPM Left Knee CPM Left Knee: Off  Activity Tolerance: Patient tolerated treatment well Patient left: in chair;with call bell/phone within reach (with bone foam applied)  OT Visit Diagnosis: Other abnormalities of gait and mobility (R26.89);Pain Pain - Right/Left: Left Pain - part of body: Knee                Time: 1535-1605 OT Time Calculation (min): 30 min Charges:  OT General Charges $OT Visit: 1 Procedure OT Evaluation $OT Eval Low Complexity: 1 Procedure OT Treatments $Self Care/Home Management : 8-22 mins G-Codes: OT G-codes **NOT FOR INPATIENT CLASS** Functional Assessment Tool Used: Clinical judgement Functional Limitation: Self care Self Care Current Status (V3710): At least 1 percent but less than 20 percent impaired, limited or restricted Self Care Goal Status (G2694): At least 1 percent but less than 20 percent impaired, limited or restricted Self Care Discharge Status [phone]): At least 1 percent but less than 20 percent impaired, limited or restricted   Norman Herrlich, [place] OTR/L  Pager: [place]mer[place] 12/24/2016, 4:28 PM

## 2016-12-24 NOTE — Anesthesia Procedure Notes (Addendum)
Anesthesia Regional Block: Adductor canal block   Pre-Anesthetic Checklist: ,, timeout performed, Correct Patient, Correct Site, Correct Laterality, Correct Procedure, Correct Position, site marked, Risks and benefits discussed,  Surgical consent,  Pre-op evaluation,  At surgeon's request and post-op pain management  Laterality: Left and Lower  Prep: chloraprep       Needles:   Needle Type: Echogenic Stimulator Needle     Needle Length: 9cm    Needle insertion depth: 4 cm   Additional Needles:   Procedures: ultrasound guided,,,,,,,,  Narrative:  Start time: 12/24/2016 7:00 AM End time: 12/24/2016 7:15 AM Injection made incrementally with aspirations every 5 mL.  Performed by: Personally  Anesthesiologist: Ami Mally

## 2016-12-24 NOTE — Op Note (Signed)
TOTAL KNEE REPLACEMENT OPERATIVE NOTE:  12/24/2016  1:02 PM  PATIENT:  Natasha Meyer  61 y.o. female  PRE-OPERATIVE DIAGNOSIS:  primary osteoarthritis left knee  POST-OPERATIVE DIAGNOSIS:  primary osteoarthritis left knee  PROCEDURE:  Procedure(s): TOTAL KNEE ARTHROPLASTY  SURGEON:  Surgeon(s): Dannielle Huh, MD  PHYSICIAN ASSISTANT: Laurier Nancy, [facility]  ANESTHESIA:   spinal  DRAINS: Hemovac  SPECIMEN: None  COUNTS:  Correct  TOURNIQUET:   Total Tourniquet Time Documented: Thigh (Left) - 38 minutes Total: Thigh (Left) - 38 minutes   DICTATION:  Indication for procedure:    The patient is a 61 y.o. female who has failed conservative treatment for primary osteoarthritis left knee.  Informed consent was obtained prior to anesthesia. The risks versus benefits of the operation were explain and in a way the patient can, and did, understand.   On the implant demand matching protocol, this patient scored 9.  Therefore, this patient was not receive a polyethylene insert with vitamin E which is a high demand implant.  Description of procedure:     The patient was taken to the operating room and placed under anesthesia.  The patient was positioned in the usual fashion taking care that all body parts were adequately padded and/or protected.  I foley catheter was not placed.  A tourniquet was applied and the leg prepped and draped in the usual sterile fashion.  The extremity was exsanguinated with the esmarch and tourniquet inflated to 350 mmHg.  Pre-operative range of motion was normal.  The knee was in 5 degree of mild valgus.  A midline incision approximately 6-7 inches long was made with a #10 blade.  A new blade was used to make a parapatellar arthrotomy going 2-3 cm into the quadriceps tendon, over the patella, and alongside the medial aspect of the patellar tendon.  A synovectomy was then performed with the #10 blade and forceps. I then elevated the deep MCL off the medial tibial  metaphysis subperiosteally around to the semimembranosus attachment.    I everted the patella and used calipers to measure patellar thickness.  I used the reamer to ream down to appropriate thickness to recreate the native thickness.  I then removed excess bone with the rongeur and sagittal saw.  I used the appropriately sized template and drilled the three lug holes.  I then put the trial in place and measured the thickness with the calipers to ensure recreation of the native thickness.  The trial was then removed and the patella subluxed and the knee brought into flexion.  A homan retractor was place to retract and protect the patella and lateral structures.  A Z-retractor was place medially to protect the medial structures.  The extra-medullary alignment system was used to make cut the tibial articular surface perpendicular to the anamotic axis of the tibia and in 3 degrees of posterior slope.  The cut surface and alignment jig was removed.  I then used the intramedullary alignment guide to make a 4 valgus cut on the distal femur.  I then marked out the epicondylar axis on the distal femur.  The posterior condylar axis measured 3 degrees.  I then used the anterior referencing sizer and measured the femur to be a size 5.  The 4-In-1 cutting block was screwed into place in external rotation matching the posterior condylar angle, making our cuts perpendicular to the epicondylar axis.  Anterior, posterior and chamfer cuts were made with the sagittal saw.  The cutting block and cut pieces were  removed.  A lamina spreader was placed in 90 degrees of flexion.  The ACL, PCL, menisci, and posterior condylar osteophytes were removed.  A 10 mm spacer blocked was found to offer Meyer flexion and extension gap balance after mild in degree releasing.   The scoop retractor was then placed and the femoral finishing block was pinned in place.  The small sagittal saw was used as well as the lug drill to finish the femur.   The block and cut surfaces were removed and the medullary canal hole filled with autograft bone from the cut pieces.  The tibia was delivered forward in deep flexion and external rotation.  A size C tray was selected and pinned into place centered on the medial 1/3 of the tibial tubercle.  The reamer and keel was used to prepare the tibia through the tray.    I then trialed with the size 5 femur, size C tibia, a 10 mm insert and the 30 patella.  I had excellent flexion/extension gap balance, excellent patella tracking.  Flexion was full and beyond 120 degrees; extension was zero.  These components were chosen and the staff opened them to me on the back table while the knee was lavaged copiously and the cement mixed.  The soft tissue was infiltrated with 60cc of exparel 1.3% through a 21 gauge needle.  I cemented in the components and removed all excess cement.  The polyethylene tibial component was snapped into place and the knee placed in extension while cement was hardening.  The capsule was infilltrated with 30cc of .25% Marcaine with epinephrine.  A hemovac was place in the joint exiting superolaterally.  A pain pump was place superomedially superficial to the arthrotomy.  Once the cement was hard, the tourniquet was let down.  Hemostasis was obtained.  The arthrotomy was closed with figure-8 #1 vicryl sutures.  The deep soft tissues were closed with #0 vicryls and the subcuticular layer closed with a running #2-0 vicryl.  The skin was reapproximated and closed with skin staples.  The wound was dressed with xeroform, 4 x4's, 2 ABD sponges, a single layer of webril and a TED stocking.   The patient was then awakened, extubated, and taken to the recovery room in stable condition.  BLOOD LOSS:  300cc DRAINS: 1 hemovac, 1 pain catheter COMPLICATIONS:  None.  PLAN OF CARE: Admit to inpatient   PATIENT DISPOSITION:  PACU - hemodynamically stable.   Delay start of Pharmacological VTE agent (>24hrs)  due to surgical blood loss or risk of bleeding:  not applicable  Please fax a copy of this op note to my office at [phone] (please only include page 1 and 2 of the Case Information op note)

## 2016-12-24 NOTE — Anesthesia Postprocedure Evaluation (Signed)
Anesthesia Post Note  Patient: Natasha Meyer  Procedure(s) Performed: Procedure(s) (LRB): TOTAL KNEE ARTHROPLASTY (Left)     Patient location during evaluation: PACU Anesthesia Type: Spinal Level of consciousness: oriented and awake and alert Pain management: pain level controlled Vital Signs Assessment: post-procedure vital signs reviewed and stable Respiratory status: spontaneous breathing, respiratory function stable and patient connected to nasal cannula oxygen Cardiovascular status: blood pressure returned to baseline and stable Postop Assessment: no headache and no backache Anesthetic complications: no    Last Vitals:  Vitals:   12/24/16 1002 12/24/16 1017  BP: 129/85 (!) 146/69  Pulse: 64 71  Resp: 13 (!) 21  Temp:      Last Pain:  Vitals:   12/24/16 0613  TempSrc: Oral                 Talani Brazee,JAMES TERRILL

## 2016-12-24 NOTE — Progress Notes (Signed)
RT set up patient CPAP HS on 12. NO O2 bleed in needed. Patient has home mask and can put it on when she is ready.

## 2016-12-24 NOTE — Anesthesia Procedure Notes (Signed)
Spinal  Patient location during procedure: OR Staffing Anesthesiologist: Abhimanyu Cruces Performed: anesthesiologist  Preanesthetic Checklist Completed: patient identified, site marked, surgical consent, pre-op evaluation, timeout performed, IV checked, risks and benefits discussed and monitors and equipment checked Spinal Block Patient position: sitting Prep: DuraPrep Patient monitoring: heart rate, continuous pulse ox and blood pressure Approach: right paramedian Location: L4-5 Injection technique: single-shot Needle Needle type: Sprotte  Needle gauge: 24 G Needle length: 9 cm Additional Notes Expiration date of kit checked and confirmed. Patient tolerated procedure well, without complications.       

## 2016-12-24 NOTE — Progress Notes (Signed)
Orthopedic Tech Progress Note Patient Details:  Natasha Meyer 01/18/1956 [id number]  CPM Left Knee CPM Left Knee: On Left Knee Flexion (Degrees): 90 Left Knee Extension (Degrees): 0 Additional Comments: Trapeze bar and foot roll   Saul FordyceJennifer C Nikyah Lackman 12/24/2016, 10:09 AM

## 2016-12-24 NOTE — Transfer of Care (Signed)
Immediate Anesthesia Transfer of Care Note  Patient: Natasha Meyer  Procedure(s) Performed: Procedure(s): TOTAL KNEE ARTHROPLASTY (Left)  Patient Location: PACU  Anesthesia Type:Spinal and MAC combined with regional for post-op pain  Level of Consciousness: awake, alert , oriented and patient cooperative  Airway & Oxygen Therapy: Patient Spontanous Breathing  Post-op Assessment: Report given to RN and Post -op Vital signs reviewed and stable  Post vital signs: Reviewed and stable  Last Vitals:  Vitals:   12/24/16 0729 12/24/16 0915  BP:    Pulse: 93 81  Resp:  (!) 25  Temp:  36.4 C    Last Pain:  Vitals:   12/24/16 0613  TempSrc: Oral      Patients Stated Pain Goal: 5 (24/09/73 5329)  Complications: No apparent anesthesia complications

## 2016-12-24 NOTE — Evaluation (Signed)
Physical Therapy Evaluation Patient Details Name: Natasha Meyer MRN: [id number] DOB: 1955-09-11 Today's Date: 12/24/2016   History of Present Illness  Pt is a 61 y/o female s/p elective L TKA secondary to L knee OA. PMH includes DM, asthma, HTN, sleep apnea, fribromyalgia, and R TKA.   Clinical Impression  Pt is s/p elective surgery above with deficits below. PTA, pt was independent with use of cane. Upon evaluation, pt limited by post op pain and weakness. Ambulation distance limited secondary to pain; RN notified and giving pain meds at end of session. Required min guard assist throughout for safety with mobility. Pt reports mother will come to stay with her upon d/c and has all equipment at home. Follow up recommendations per MD arrangements. Will continue to follow acutely to progress mobility.     Follow Up Recommendations DC plan and follow up therapy as arranged by surgeon;Supervision/Assistance - 24 hour    Equipment Recommendations  None recommended by PT    Recommendations for Other Services       Precautions / Restrictions Precautions Precautions: Knee Precaution Booklet Issued: Yes (comment) Precaution Comments: Reviewed supine ther ex with pt.  Restrictions Weight Bearing Restrictions: Yes LLE Weight Bearing: Weight bearing as tolerated      Mobility  Bed Mobility Overal bed mobility: Needs Assistance Bed Mobility: Supine to Sit     Supine to sit: Min guard;HOB elevated     General bed mobility comments: Min guard for safety.   Transfers Overall transfer level: Needs assistance Equipment used: Rolling walker (2 wheeled) Transfers: Sit to/from Stand Sit to Stand: Min guard         General transfer comment: Min guard for safety. Demonstrated safe hand placement.   Ambulation/Gait Ambulation/Gait assistance: Min guard Ambulation Distance (Feet): 40 Feet Assistive device: Rolling walker (2 wheeled) Gait Pattern/deviations: Step-to pattern;Decreased step  length - left;Decreased weight shift to left;Antalgic Gait velocity: Decreased Gait velocity interpretation: Below normal speed for age/gender General Gait Details: Slow, antalgic gait. Initially not putting any weight through LLE, however, with encouragement, able to accept weight onto LLE. Verbal cues for sequencing with RW. Min guard for safety. Ambulation limited secondary to pain.   Stairs            Wheelchair Mobility    Modified Rankin (Stroke Patients Only)       Balance Overall balance assessment: Needs assistance Sitting-balance support: No upper extremity supported;Feet supported Sitting balance-Leahy Scale: Good     Standing balance support: Bilateral upper extremity supported;No upper extremity supported;During functional activity Standing balance-Leahy Scale: Fair Standing balance comment: Able to maintain standing balance with support from sink to wash hands.                              Pertinent Vitals/Pain Pain Assessment: 0-10 Pain Score: 9  Pain Location: L knee Pain Descriptors / Indicators: Constant;Aching;Operative site guarding Pain Intervention(s): Limited activity within patient's tolerance;Monitored during session;Repositioned    Home Living Family/patient expects to be discharged to:: Private residence Living Arrangements: Alone Available Help at Discharge: Family;Available 24 hours/day (mother is coming to stay ) Type of Home: House Home Access: Ramped entrance     Home Layout: One level Home Equipment: Walker - 2 wheels;Bedside commode;Shower seat;Other (comment);Cane - single point (CPM)      Prior Function Level of Independence: Independent with assistive device(s)         Comments: Used cane for ambulation  Hand Dominance   Dominant Hand: Right    Extremity/Trunk Assessment   Upper Extremity Assessment Upper Extremity Assessment: Defer to OT evaluation    Lower Extremity Assessment Lower Extremity  Assessment: LLE deficits/detail LLE Deficits / Details: Deficits consistent with post op pain and weakness. Sensory in tact. Able to perform exercises below.     Cervical / Trunk Assessment Cervical / Trunk Assessment: Normal  Communication   Communication: No difficulties  Cognition Arousal/Alertness: Awake/alert Behavior During Therapy: WFL for tasks assessed/performed Overall Cognitive Status: Within Functional Limits for tasks assessed                                        General Comments      Exercises Total Joint Exercises Ankle Circles/Pumps: AROM;Both;15 reps;Supine Quad Sets: AROM;Left;10 reps;Supine Towel Squeeze: AROM;Both;10 reps;Supine Short Arc Quad: AROM;Left;10 reps;Supine Heel Slides: AROM;Left;10 reps;Supine Hip ABduction/ADduction: AROM;Left;10 reps;Supine Straight Leg Raises: AROM;Left;10 reps;Supine   Assessment/Plan    PT Assessment Patient needs continued PT services  PT Problem List Decreased strength;Decreased range of motion;Decreased activity tolerance;Decreased balance;Decreased mobility;Decreased knowledge of use of DME;Decreased knowledge of precautions;Pain       PT Treatment Interventions DME instruction;Gait training;Functional mobility training;Therapeutic activities;Therapeutic exercise;Balance training;Neuromuscular re-education;Patient/family education    PT Goals (Current goals can be found in the Care Plan section)  Acute Rehab PT Goals Patient Stated Goal: to decrease pain  PT Goal Formulation: With patient Time For Goal Achievement: 12/31/16 Potential to Achieve Goals: Good    Frequency 7X/week   Barriers to discharge        Co-evaluation               AM-PAC PT "6 Clicks" Daily Activity  Outcome Measure Difficulty turning over in bed (including adjusting bedclothes, sheets and blankets)?: A Little Difficulty moving from lying on back to sitting on the side of the bed? : A Little Difficulty sitting  down on and standing up from a chair with arms (e.g., wheelchair, bedside commode, etc,.)?: Total Help needed moving to and from a bed to chair (including a wheelchair)?: A Little Help needed walking in hospital room?: A Little Help needed climbing 3-5 steps with a railing? : A Little 6 Click Score: 16    End of Session Equipment Utilized During Treatment: Gait belt Activity Tolerance: Patient limited by pain Patient left: in chair;with call bell/phone within reach Nurse Communication: Mobility status;Patient requests pain meds PT Visit Diagnosis: Other abnormalities of gait and mobility (R26.89);Pain Pain - Right/Left: Left Pain - part of body: Knee    Time: [id number] PT Time Calculation (min) (ACUTE ONLY): 21 min   Charges:   PT Evaluation $PT Eval Low Complexity: 1 Procedure PT Treatments $Gait Training: 8-22 mins   PT G Codes:   PT G-Codes **NOT FOR INPATIENT CLASS** Functional Assessment Tool Used: AM-PAC 6 Clicks Basic Mobility Functional Limitation: Mobility: Walking and moving around Mobility: Walking and Moving Around Current Status ([id number]([id number]): At least 40 percent but less than 60 percent impaired, limited or restricted Mobility: Walking and Moving Around Goal Status [phone](G8979): At least 1 percent but less than 20 percent impaired, limited or restricted    Margot ChimesBrittany Smith, PT, DPT  Acute Rehabilitation Services  Pager: [phone]   Melvyn NovasBrittany L Smith 12/24/2016, 3:12 PM

## 2016-12-24 NOTE — Care Management Note (Signed)
Case Management Note  Patient Details  Name: Natasha Meyer MRN: [id number] Date of Birth: 05/03/1956  Subjective/Objective:    Left TKA                Action/Plan: Discharge Planning: NCM spoke to pt and offered choice for HH/list provided. Pt agreeable to [facility]. Has RW, CPM and bedside commode at home. Mother will be at home to assist in care.   PCP Marylen PontoHOLT, LYNLEY S MD   Expected Discharge Date:                  Expected Discharge Plan:  Home w Home Health Services  In-House Referral:  NA  Discharge planning Services  CM Consult  Post Acute Care Choice:  Home Health Choice offered to:  Patient  DME Arranged:  3-N-1, Walker rolling, CPM DME Agency:  [employer]  HH Arranged:  PT HH Agency:  [facility] (formerly [facility])  Status of Service:  Completed, signed off  If discussed at [employer] Meetings, dates discussed:    Additional Comments:  Elliot CousinShavis, Shemica Meath Ellen, RN 12/24/2016, 7:23 PM

## 2016-12-25 ENCOUNTER — Encounter (HOSPITAL_COMMUNITY): Payer: Self-pay | Admitting: Orthopedic Surgery

## 2016-12-25 DIAGNOSIS — M21062 Valgus deformity, not elsewhere classified, left knee: Secondary | ICD-10-CM | POA: Diagnosis not present

## 2016-12-25 DIAGNOSIS — M797 Fibromyalgia: Secondary | ICD-10-CM | POA: Diagnosis not present

## 2016-12-25 DIAGNOSIS — E119 Type 2 diabetes mellitus without complications: Secondary | ICD-10-CM | POA: Diagnosis not present

## 2016-12-25 DIAGNOSIS — J45909 Unspecified asthma, uncomplicated: Secondary | ICD-10-CM | POA: Diagnosis not present

## 2016-12-25 DIAGNOSIS — I10 Essential (primary) hypertension: Secondary | ICD-10-CM | POA: Diagnosis not present

## 2016-12-25 DIAGNOSIS — F329 Major depressive disorder, single episode, unspecified: Secondary | ICD-10-CM | POA: Diagnosis not present

## 2016-12-25 DIAGNOSIS — M25762 Osteophyte, left knee: Secondary | ICD-10-CM | POA: Diagnosis not present

## 2016-12-25 DIAGNOSIS — M1712 Unilateral primary osteoarthritis, left knee: Secondary | ICD-10-CM | POA: Diagnosis not present

## 2016-12-25 DIAGNOSIS — K219 Gastro-esophageal reflux disease without esophagitis: Secondary | ICD-10-CM | POA: Diagnosis not present

## 2016-12-25 DIAGNOSIS — Z96652 Presence of left artificial knee joint: Secondary | ICD-10-CM | POA: Diagnosis not present

## 2016-12-25 LAB — CBC
HCT: 35.8 % — ABNORMAL LOW (ref 36.0–46.0)
Hemoglobin: 11.6 g/dL — ABNORMAL LOW (ref 12.0–15.0)
MCH: 28.6 pg (ref 26.0–34.0)
MCHC: 32.4 g/dL (ref 30.0–36.0)
MCV: 88.4 fL (ref 78.0–100.0)
PLATELETS: 274 10*3/uL (ref 150–400)
RBC: 4.05 MIL/uL (ref 3.87–5.11)
RDW: 13.9 % (ref 11.5–15.5)
WBC: 16.1 10*3/uL — AB (ref 4.0–10.5)

## 2016-12-25 LAB — BASIC METABOLIC PANEL
ANION GAP: 10 (ref 5–15)
BUN: 8 mg/dL (ref 6–20)
CO2: 26 mmol/L (ref 22–32)
CREATININE: 0.55 mg/dL (ref 0.44–1.00)
Calcium: 8.8 mg/dL — ABNORMAL LOW (ref 8.9–10.3)
Chloride: 102 mmol/L (ref 101–111)
GFR calc Af Amer: >60 mL/min (ref >60)
GLUCOSE: 126 mg/dL — AB (ref 65–99)
Potassium: 3.7 mmol/L (ref 3.5–5.1)
Sodium: 138 mmol/L (ref 135–145)

## 2016-12-25 MED ORDER — OXYCODONE HCL 10 MG PO TABS: 5.0000 mg | ORAL_TABLET | ORAL | 0 refills | Status: DC | PRN

## 2016-12-25 MED ORDER — METHOCARBAMOL 500 MG PO TABS: 500.0000 mg | ORAL_TABLET | Freq: Four times a day (QID) | ORAL | 0 refills | Status: DC | PRN

## 2016-12-25 MED ORDER — TRAMADOL HCL 50 MG PO TABS: 50.0000 mg | ORAL_TABLET | Freq: Four times a day (QID) | ORAL | 0 refills | Status: DC | PRN

## 2016-12-25 MED ORDER — ASPIRIN 325 MG PO TBEC: 325.0000 mg | DELAYED_RELEASE_TABLET | Freq: Two times a day (BID) | ORAL | 0 refills | Status: DC

## 2016-12-25 NOTE — Discharge Summary (Signed)
SPORTS MEDICINE & JOINT REPLACEMENT   Georgena Spurling, MD   Laurier Nancy, PA-C [address] [place], [place], [place]  [address]                             [phone]  PATIENT ID: Natasha Meyer        MRN:  [id number]          DOB/AGE: Jan 03, 1956 / 61 y.o.    DISCHARGE SUMMARY  ADMISSION DATE:    12/24/2016 DISCHARGE DATE:   12/25/2016   ADMISSION DIAGNOSIS: primary osteoarthritis left knee    DISCHARGE DIAGNOSIS:  primary osteoarthritis left knee    ADDITIONAL DIAGNOSIS: Active Problems:   S/P total knee replacement  Past Medical History:  Diagnosis Date  . Anemia   . Arthritis   . Asthma   . Depression   . Diabetes mellitus without complication (HCC)   . Fibromyalgia   . GERD (gastroesophageal reflux disease)   . Hepatitis    history of "C" ,took treatments no longer have it  . Hypertension   . Pneumonia    history  . Sleep apnea     PROCEDURE: Procedure(s): TOTAL KNEE ARTHROPLASTY on 12/24/2016  CONSULTS:    HISTORY:  See H&P in chart  HOSPITAL COURSE:  LUKE RIGSBEE is a 61 y.o. admitted on 12/24/2016 and found to have a diagnosis of primary osteoarthritis left knee.  After appropriate laboratory studies were obtained  they were taken to the operating room on 12/24/2016 and underwent Procedure(s): TOTAL KNEE ARTHROPLASTY.   They were given perioperative antibiotics:  Anti-infectives    Start     Dose/Rate Route Frequency Ordered Stop   12/24/16 1400  ceFAZolin (ANCEF) IVPB 1 g/50 mL premix     1 g 100 mL/hr over 30 Minutes Intravenous Every 6 hours 12/24/16 1115 12/24/16 2225   12/24/16 0700  ceFAZolin (ANCEF) IVPB 2g/100 mL premix     2 g 200 mL/hr over 30 Minutes Intravenous To [facility] 12/21/16 0712 12/24/16 0759    .  Patient given tranexamic acid IV or topical and exparel intra-operatively.  Tolerated the procedure well.    POD# 1: Vital signs were stable.  Patient denied Chest pain, shortness of breath, or calf pain.  Patient was started on  Lovenox 30 mg subcutaneously twice daily at 8am.  Consults to PT, OT, and care management were made.  The patient was weight bearing as tolerated.  CPM was placed on the operative leg 0-90 degrees for 6-8 hours a day. When out of the CPM, patient was placed in the foam block to achieve full extension. Incentive spirometry was taught.  Dressing was changed.       POD #2, Continued  PT for ambulation and exercise program.  IV saline locked.  O2 discontinued.    The remainder of the hospital course was dedicated to ambulation and strengthening.   The patient was discharged on 1 Day Post-Op in  Good condition.  Blood products given:none  DIAGNOSTIC STUDIES: Recent vital signs: Patient Vitals for the past 24 hrs:  BP Temp Temp src Pulse Resp SpO2  12/25/16 0444 140/76 98 F (36.7 C) Oral (!) 105 18 94 %  12/25/16 0120 140/76 97.9 F (36.6 C) Oral 91 20 95 %  12/24/16 2305 - - - 98 18 98 %  12/24/16 2101 111/80 98 F (36.7 C) Oral (!) 104 20 98 %  Recent laboratory studies:  Recent Labs  12/25/16 0628  WBC 16.1*  HGB 11.6*  HCT 35.8*  PLT 274    Recent Labs  12/25/16 0628  NA 138  K 3.7  CL 102  CO2 26  BUN 8  CREATININE 0.55  GLUCOSE 126*  CALCIUM 8.8*   Lab Results  Component Value Date   INR 2.9 (H) 11/16/2007   INR 1.9 (H) 11/15/2007   INR 2.1 (H) 11/14/2007     Recent Radiographic Studies :  No results found.  DISCHARGE INSTRUCTIONS: Discharge Instructions    CPM    Complete by:  As directed    Continuous passive motion machine (CPM):      Use the CPM from 0 to 90 for 4-6 hours per day.      You may increase by 10 per day.  You may break it up into 2 or 3 sessions per day.      Use CPM for 2 weeks or until you are told to stop.   Call MD / Call 911    Complete by:  As directed    If you experience chest pain or shortness of breath, CALL 911 and be transported to the hospital emergency room.  If you develope a fever above 101 F, pus (white drainage)  or increased drainage or redness at the wound, or calf pain, call your surgeon's office.   Constipation Prevention    Complete by:  As directed    Drink plenty of fluids.  Prune juice may be helpful.  You may use a stool softener, such as Colace (over the counter) 100 mg twice a day.  Use MiraLax (over the counter) for constipation as needed.   Diet - low sodium heart healthy    Complete by:  As directed    Discharge instructions    Complete by:  As directed    INSTRUCTIONS AFTER JOINT REPLACEMENT   Remove items at home which could result in a fall. This includes throw rugs or furniture in walking pathways ICE to the affected joint every three hours while awake for 30 minutes at a time, for at least the first 3-5 days, and then as needed for pain and swelling.  Continue to use ice for pain and swelling. You may notice swelling that will progress down to the foot and ankle.  This is normal after surgery.  Elevate your leg when you are not up walking on it.   Continue to use the breathing machine you got in the hospital (incentive spirometer) which will help keep your temperature down.  It is common for your temperature to cycle up and down following surgery, especially at night when you are not up moving around and exerting yourself.  The breathing machine keeps your lungs expanded and your temperature down.   DIET:  As you were doing prior to hospitalization, we recommend a well-balanced diet.  DRESSING / WOUND CARE / SHOWERING  Keep the surgical dressing until follow up.  The dressing is water proof, so you can shower without any extra covering.  IF THE DRESSING FALLS OFF or the wound gets wet inside, change the dressing with sterile gauze.  Please use good hand washing techniques before changing the dressing.  Do not use any lotions or creams on the incision until instructed by your surgeon.    ACTIVITY  Increase activity slowly as tolerated, but follow the weight bearing instructions below.    No driving for 6 weeks or until further  direction given by your physician.  You cannot drive while taking narcotics.  No lifting or carrying greater than 10 lbs. until further directed by your surgeon. Avoid periods of inactivity such as sitting longer than an hour when not asleep. This helps prevent blood clots.  You may return to work once you are authorized by your doctor.     WEIGHT BEARING   Weight bearing as tolerated with assist device (walker, cane, etc) as directed, use it as long as suggested by your surgeon or therapist, typically at least 4-6 weeks.   EXERCISES  Results after joint replacement surgery are often greatly improved when you follow the exercise, range of motion and muscle strengthening exercises prescribed by your doctor. Safety measures are also important to protect the joint from further injury. Any time any of these exercises cause you to have increased pain or swelling, decrease what you are doing until you are comfortable again and then slowly increase them. If you have problems or questions, call your caregiver or physical therapist for advice.   Rehabilitation is important following a joint replacement. After just a few days of immobilization, the muscles of the leg can become weakened and shrink (atrophy).  These exercises are designed to build up the tone and strength of the thigh and leg muscles and to improve motion. Often times heat used for twenty to thirty minutes before working out will loosen up your tissues and help with improving the range of motion but do not use heat for the first two weeks following surgery (sometimes heat can increase post-operative swelling).   These exercises can be done on a training (exercise) mat, on the floor, on a table or on a bed. Use whatever works the best and is most comfortable for you.    Use music or television while you are exercising so that the exercises are a pleasant break in your day. This will make your life  better with the exercises acting as a break in your routine that you can look forward to.   Perform all exercises about fifteen times, three times per day or as directed.  You should exercise both the operative leg and the other leg as well.   Exercises include:   Quad Sets - Tighten up the muscle on the front of the thigh (Quad) and hold for 5-10 seconds.   Straight Leg Raises - With your knee straight (if you were given a brace, keep it on), lift the leg to 60 degrees, hold for 3 seconds, and slowly lower the leg.  Perform this exercise against resistance later as your leg gets stronger.  Leg Slides: Lying on your back, slowly slide your foot toward your buttocks, bending your knee up off the floor (only go as far as is comfortable). Then slowly slide your foot back down until your leg is flat on the floor again.  Angel Wings: Lying on your back spread your legs to the side as far apart as you can without causing discomfort.  Hamstring Strength:  Lying on your back, push your heel against the floor with your leg straight by tightening up the muscles of your buttocks.  Repeat, but this time bend your knee to a comfortable angle, and push your heel against the floor.  You may put a pillow under the heel to make it more comfortable if necessary.   A rehabilitation program following joint replacement surgery can speed recovery and prevent re-injury in the future due to weakened muscles. Contact your  doctor or a physical therapist for more information on knee rehabilitation.    CONSTIPATION  Constipation is defined medically as fewer than three stools per week and severe constipation as less than one stool per week.  Even if you have a regular bowel pattern at home, your normal regimen is likely to be disrupted due to multiple reasons following surgery.  Combination of anesthesia, postoperative narcotics, change in appetite and fluid intake all can affect your bowels.   YOU MUST use at least one of the  following options; they are listed in order of increasing strength to get the job done.  They are all available over the counter, and you may need to use some, POSSIBLY even all of these options:    Drink plenty of fluids (prune juice may be helpful) and high fiber foods Colace 100 mg by mouth twice a day  Senokot for constipation as directed and as needed Dulcolax (bisacodyl), take with full glass of water  Miralax (polyethylene glycol) once or twice a day as needed.  If you have tried all these things and are unable to have a bowel movement in the first 3-4 days after surgery call either your surgeon or your primary doctor.    If you experience loose stools or diarrhea, hold the medications until you stool forms back up.  If your symptoms do not get better within 1 week or if they get worse, check with your doctor.  If you experience "the worst abdominal pain ever" or develop nausea or vomiting, please contact the office immediately for further recommendations for treatment.   ITCHING:  If you experience itching with your medications, try taking only a single pain pill, or even half a pain pill at a time.  You can also use Benadryl over the counter for itching or also to help with sleep.   TED HOSE STOCKINGS:  Use stockings on both legs until for at least 2 weeks or as directed by physician office. They may be removed at night for sleeping.  MEDICATIONS:  See your medication summary on the "After Visit Summary" that nursing will review with you.  You may have some home medications which will be placed on hold until you complete the course of blood thinner medication.  It is important for you to complete the blood thinner medication as prescribed.  PRECAUTIONS:  If you experience chest pain or shortness of breath - call 911 immediately for transfer to the hospital emergency department.   If you develop a fever greater that 101 F, purulent drainage from wound, increased redness or drainage from  wound, foul odor from the wound/dressing, or calf pain - CONTACT YOUR SURGEON.                                                   FOLLOW-UP APPOINTMENTS:  If you do not already have a post-op appointment, please call the office for an appointment to be seen by your surgeon.  Guidelines for how soon to be seen are listed in your "After Visit Summary", but are typically between 1-4 weeks after surgery.  OTHER INSTRUCTIONS:   Knee Replacement:  Do not place pillow under knee, focus on keeping the knee straight while resting. CPM instructions: 0-90 degrees, 2 hours in the morning, 2 hours in the afternoon, and 2 hours in the evening. Place foam  block, curve side up under heel at all times except when in CPM or when walking.  DO NOT modify, tear, cut, or change the foam block in any way.  MAKE SURE YOU:  Understand these instructions.  Get help right away if you are not doing well or get worse.    Thank you for letting us be a part of your medical care team.  It is a privilege we respect greatly.  We hope these instructions will help you stay on track for a fast and full recovery!   Increase activity slowly as tolerated    Complete by:  As directed       DISCHARGE MEDICATIONS:   Allergies as of 12/25/2016      Reactions   Hylan G-f 20 Swelling   SWELLING REACTION UNSPECIFIED    Glucosamine-chondroitin Swelling   SWELLING REACTION UNSPECIFIED    Prednisone Hypertension   Codeine Other (See Comments)   "Passed out" with tylenol #3 but has tolerated other forms of tylenol so allergy entered for codeine.   Lisinopril    SWELLING REACTION UNSPECIFIED    Sulfa Antibiotics    SWELLING REACTION UNSPECIFIED    Albuterol Other (See Comments)   INTOLERANCE > TACHYCARDIA   Latex Dermatitis, Rash   Morphine And Related Other (See Comments)   hallucinations      Medication List    STOP taking these medications   diclofenac sodium 1 % Gel Commonly known as:  VOLTAREN   EVZIO 0.4 MG/0.4ML  Soaj Generic drug:  Naloxone HCl   tiZANidine 4 MG tablet Commonly known as:  ZANAFLEX     TAKE these medications   albuterol 108 (90 Base) MCG/ACT inhaler Commonly known as:  PROVENTIL HFA;VENTOLIN HFA Inhale 2 puffs into the lungs every 6 (six) hours as needed for wheezing or shortness of breath.   amLODipine-valsartan 5-160 MG tablet Commonly known as:  EXFORGE Take 1 tablet by mouth daily.   aspirin 325 MG EC tablet Take 1 tablet (325 mg total) by mouth 2 (two) times daily.   budesonide-formoterol 160-4.5 MCG/ACT inhaler Commonly known as:  SYMBICORT Inhale 2 puffs into the lungs 2 (two) times daily.   cetirizine 10 MG tablet Commonly known as:  ZYRTEC Take 10 mg by mouth daily.   dicyclomine 10 MG capsule Commonly known as:  BENTYL Take 10 mg by mouth 3 (three) times daily before meals.   DULoxetine 60 MG capsule Commonly known as:  CYMBALTA Take 60 mg by mouth 2 (two) times daily.   EPINEPHrine 0.3 mg/0.3 mL Soaj injection Commonly known as:  EPI-PEN Inject 0.3 mg into the muscle as needed.   estradiol 1 MG tablet Commonly known as:  ESTRACE Take 1 mg by mouth daily.   ferrous sulfate 325 (65 FE) MG EC tablet Take 325 mg by mouth 3 (three) times daily with meals.   methocarbamol 500 MG tablet Commonly known as:  ROBAXIN Take 1-2 tablets (500-1,000 mg total) by mouth every 6 (six) hours as needed for muscle spasms.   nortriptyline 25 MG capsule Commonly known as:  PAMELOR Take 25 mg by mouth at bedtime.   omeprazole 40 MG capsule Commonly known as:  PRILOSEC Take 40 mg by mouth daily.   Oxycodone HCl 10 MG Tabs Take 0.5-1 tablets (5-10 mg total) by mouth every 3 (three) hours as needed. What changed:  how much to take  when to take this  reasons to take this   pregabalin 150 MG capsule Commonly known  as:  LYRICA Take 150 mg by mouth every 8 (eight) hours.   promethazine 25 MG tablet Commonly known as:  PHENERGAN Take 25 mg by mouth every  12 (twelve) hours as needed for nausea or vomiting.   traMADol 50 MG tablet Commonly known as:  ULTRAM Take 1-2 tablets (50-100 mg total) by mouth every 6 (six) hours as needed for moderate pain.   Vitamin B-12 2500 MCG Subl Place 2,500 mcg under the tongue daily.   Vitamin D3 5000 units Caps Take 5,000 Units by mouth daily.   vitamin E 400 UNIT capsule Take 400 Units by mouth daily.            Durable Medical Equipment        Start     Ordered   12/24/16 1116  DME Walker rolling  Once    Question:  Patient needs a walker to treat with the following condition  Answer:  S/P total knee replacement   12/24/16 1115   12/24/16 1116  DME 3 n 1  Once     12/24/16 1115   12/24/16 1116  DME Bedside commode  Once    Question:  Patient needs a bedside commode to treat with the following condition  Answer:  S/P total knee replacement   12/24/16 1115      FOLLOW UP VISIT:   Follow-up Information    [facility] Follow up.   Specialty:  Home Health Services Why:  Home Health Physical Therapy Contact information: [address] [place] [address] [place] [address] [phone]           DISPOSITION: HOME VS. SNF  CONDITION:  Meriel Pica 12/25/2016, 12:43 PM

## 2016-12-25 NOTE — Progress Notes (Signed)
Verified with patient that she has equipment CPM RW 3/1 CPAP at home. [facility] No other CM needs

## 2016-12-25 NOTE — Progress Notes (Signed)
SPORTS MEDICINE AND JOINT REPLACEMENT  Natasha Spurling, MD    Natasha Nancy, PA-C [address] [place], [place], [place]  [address]                             [phone]   PROGRESS NOTE  Subjective:  negative for Chest Pain  negative for Shortness of Breath  negative for Nausea/Vomiting   negative for Calf Pain  negative for Bowel Movement   Tolerating Diet: yes         Patient reports pain as 3 on 0-10 scale.    Objective: Vital signs in last 24 hours:   Patient Vitals for the past 24 hrs:  BP Temp Temp src Pulse Resp SpO2  12/25/16 0444 140/76 98 F (36.7 C) Oral (!) 105 18 94 %  12/25/16 0120 140/76 97.9 F (36.6 C) Oral 91 20 95 %  12/24/16 2305 - - - 98 18 98 %  12/24/16 2101 111/80 98 F (36.7 C) Oral (!) 104 20 98 %  12/24/16 1100 (!) 141/82 97.9 F (36.6 C) Oral 70 18 91 %  12/24/16 1043 - 98 F (36.7 C) - 74 17 94 %  12/24/16 1032 136/77 - - 70 16 (!) 87 %  12/24/16 1017 (!) 146/69 - - 71 (!) 21 (!) 87 %  12/24/16 1002 129/85 - - 64 13 91 %  12/24/16 0947 (!) 143/76 - - 64 12 93 %  12/24/16 0945 - - - 63 13 91 %  12/24/16 0918 108/84 - - 69 11 95 %  12/24/16 0915 - 97.5 F (36.4 C) - 81 (!) 25 95 %  12/24/16 0729 - - - 93 - 96 %  12/24/16 0728 - - - 82 - 97 %  12/24/16 0727 - - - 87 - 98 %  12/24/16 0726 - - - 80 - 96 %  12/24/16 0725 - - - 85 - 96 %  12/24/16 0724 - - - 77 - 95 %  12/24/16 0723 - - - 84 - 95 %  12/24/16 0722 - - - 82 - 95 %  12/24/16 0721 - - - 82 - 95 %  12/24/16 0720 - - - 76 - 95 %  12/24/16 0719 - - - 78 - 96 %  12/24/16 0718 - - - 77 - 96 %  12/24/16 0717 - - - 77 - 98 %  12/24/16 0716 - - - 81 - 97 %  12/24/16 0715 - - - 76 - 96 %    @flow {1959:LAST@   Intake/Output from previous day:   06/04 0701 - 06/05 0700 In: 1480 [P.O.:480; I.V.:1000] Out: 1605 [Urine:1575]   Intake/Output this shift:   No intake/output data recorded.   Intake/Output      06/04 0701 - 06/05 0700   P.O. 480   I.V. 1000   IV Piggyback 0   Total Intake 1480   Urine 1575   Stool 0   Blood 30   Total Output 1605   Net -125       Urine Occurrence 6 x   Stool Occurrence 2 x      LABORATORY DATA: No results for input(s): WBC, HGB, HCT, PLT in the last 168 hours. No results for input(s): NA, K, CL, CO2, BUN, CREATININE, GLUCOSE, CALCIUM in the last 168 hours. Lab Results  Component Value Date   INR 2.9 (H) 11/16/2007   INR 1.9 (H) 11/15/2007  INR 2.1 (H) 11/14/2007    Examination:  General appearance: alert, cooperative and no distress Extremities: extremities normal, atraumatic, no cyanosis or edema  Wound Exam: clean, dry, intact   Drainage:  None: wound tissue dry  Motor Exam: Quadriceps and Hamstrings Intact  Sensory Exam: Superficial Peroneal, Deep Peroneal and Tibial normal   Assessment:    1 Day Post-Op  Procedure(s) (LRB): TOTAL KNEE ARTHROPLASTY (Left)  ADDITIONAL DIAGNOSIS:  Active Problems:   S/P total knee replacement     Plan: Physical Therapy as ordered Weight Bearing as Tolerated (WBAT)  DVT Prophylaxis:  Aspirin  DISCHARGE PLAN: Home  DISCHARGE NEEDS: HHPT   Patient doing very well, expected D/C home today         Natasha Meyer 12/25/2016, 6:50 AM

## 2016-12-25 NOTE — Progress Notes (Signed)
Patient to be discharged. Discharge instructions and prescriptions reviewed with patient. Patient also told there is a prescription at the doctors office for her as well. Patient stated understanding. IV removed. Patient has family here for transportation

## 2016-12-25 NOTE — Progress Notes (Signed)
Orthopedic Tech Progress Note Patient Details:  Natasha Meyer 06/05/1956 [id number]  Patient ID: Natasha Gooderesa A Perko, female   DOB: 04/15/1956, 61 y.o.   MRN: [id number]   Nikki DomCrawford, Alondra Sahni 12/25/2016, 1:15 PM Placed pt's lle on cpm@0 -90 degrees @1315 ; RN notified

## 2016-12-25 NOTE — Care Management Obs Status (Signed)
MEDICARE OBSERVATION STATUS NOTIFICATION   Patient Details  Name: Natasha Meyer MRN: [id number] Date of Birth: 07/02/1956   Medicare Observation Status Notification Given:  Yes    Lawerance Sabalebbie Sundeep Cary, RN 12/25/2016, 1:04 PM

## 2016-12-25 NOTE — Progress Notes (Signed)
Physical Therapy Treatment Patient Details Name: Natasha Meyer MRN: [id number] DOB: 01/28/1956 Today's Date: 12/25/2016    History of Present Illness Pt is a 61 y/o female s/p elective L TKA secondary to L knee OA. PMH includes DM, asthma, HTN, sleep apnea, fribromyalgia, and R TKA.     PT Comments    Pt performed increased gait.  Reports she has a ramp to enter home.  PTA reviewed HEP with patient.  Will f/u in pm to review seated flexion exercises.  Pt is progressing well.    Follow Up Recommendations  DC plan and follow up therapy as arranged by surgeon;Supervision/Assistance - 24 hour     Equipment Recommendations  None recommended by PT    Recommendations for Other Services       Precautions / Restrictions Precautions Precautions: Knee Precaution Booklet Issued: No Restrictions Weight Bearing Restrictions: Yes LLE Weight Bearing: Weight bearing as tolerated    Mobility  Bed Mobility Overal bed mobility: Needs Assistance Bed Mobility: Supine to Sit     Supine to sit: Supervision     General bed mobility comments: Good technique no assistance needed.  Supervision for safety.    Transfers Overall transfer level: Needs assistance Equipment used: Rolling walker (2 wheeled) Transfers: Sit to/from Stand Sit to Stand: Supervision         General transfer comment: Cues for hand placement to and from seated surface.    Ambulation/Gait Ambulation/Gait assistance: Min guard Ambulation Distance (Feet): 600 Feet Assistive device: Rolling walker (2 wheeled) Gait Pattern/deviations: Step-through pattern;Trunk flexed;Decreased stride length Gait velocity: increased  Gait velocity interpretation: at or above normal speed for age/gender General Gait Details: Cues to keep hands on RW or to stop completely when taking hands off.    Pt with slight weave but no LOB.     Stairs            Wheelchair Mobility    Modified Rankin (Stroke Patients Only)        Balance Overall balance assessment: Needs assistance Sitting-balance support: No upper extremity supported;Feet supported Sitting balance-Leahy Scale: Good       Standing balance-Leahy Scale: Fair                              Cognition Arousal/Alertness: Awake/alert Behavior During Therapy: WFL for tasks assessed/performed Overall Cognitive Status: Within Functional Limits for tasks assessed                                        Exercises Total Joint Exercises Ankle Circles/Pumps: AROM;Both;15 reps;Supine Quad Sets: AROM;Left;10 reps;Supine Towel Squeeze: AROM;Both;10 reps;Supine Short Arc Quad: AROM;Left;10 reps;Supine Heel Slides: AROM;Left;10 reps;Supine Hip ABduction/ADduction: AROM;Left;10 reps;Supine Straight Leg Raises: AROM;Left;10 reps;Supine Long Arc Quad: AROM;10 reps;Seated Goniometric ROM: 95 degree L knee.      General Comments        Pertinent Vitals/Pain Pain Assessment: 0-10 Pain Score: 4  Pain Location: L knee Pain Descriptors / Indicators: Constant;Aching;Operative site guarding Pain Intervention(s): Monitored during session;Repositioned    Home Living                      Prior Function            PT Goals (current goals can now be found in the care plan section) Acute Rehab PT Goals Patient Stated Goal:  to decrease pain  Potential to Achieve Goals: Good Progress towards PT goals: Progressing toward goals    Frequency    7X/week      PT Plan Current plan remains appropriate    Co-evaluation              AM-PAC PT "6 Clicks" Daily Activity  Outcome Measure  Difficulty turning over in bed (including adjusting bedclothes, sheets and blankets)?: A Little Difficulty moving from lying on back to sitting on the side of the bed? : A Little Difficulty sitting down on and standing up from a chair with arms (e.g., wheelchair, bedside commode, etc,.)?: Total Help needed moving to and from a bed  to chair (including a wheelchair)?: A Little Help needed walking in hospital room?: A Little Help needed climbing 3-5 steps with a railing? : A Little 6 Click Score: 16    End of Session Equipment Utilized During Treatment: Gait belt Activity Tolerance: Patient limited by pain Patient left: in chair;with call bell/phone within reach Nurse Communication: Mobility status;Patient requests pain meds PT Visit Diagnosis: Other abnormalities of gait and mobility (R26.89);Pain Pain - Right/Left: Left Pain - part of body: Knee     Time: 1610-9604 PT Time Calculation (min) (ACUTE ONLY): 30 min  Charges:  $Gait Training: 8-22 mins $Therapeutic Exercise: 8-22 mins                    G Codes:       Joycelyn Rua, PTA pager [phone]    Florestine Avers 12/25/2016, 9:51 AM

## 2016-12-25 NOTE — Progress Notes (Signed)
Physical Therapy Treatment Patient Details Name: Natasha Meyer A Allende MRN: [id number] DOB: 05/15/1956 Today's Date: 12/25/2016    History of Present Illness Pt is a 61 y/o female s/p elective L TKA secondary to L knee OA. PMH includes DM, asthma, HTN, sleep apnea, fribromyalgia, and R TKA.     PT Comments    Pt progressing towards goals. Limited this session in ambulation tolerance secondary to increased pain. Continues to require safety cues for use of RW during ambulation, and required supervision to min guard for safety throughout. Current recommendations appropriate. Will continue to follow acutely.   Follow Up Recommendations  DC plan and follow up therapy as arranged by surgeon;Supervision/Assistance - 24 hour     Equipment Recommendations  None recommended by PT    Recommendations for Other Services       Precautions / Restrictions Precautions Precautions: Knee Precaution Booklet Issued: Yes (comment) Precaution Comments: Reviewed seated ther ex with pt  Restrictions Weight Bearing Restrictions: Yes LLE Weight Bearing: Weight bearing as tolerated    Mobility  Bed Mobility Overal bed mobility: Needs Assistance Bed Mobility: Supine to Sit     Supine to sit: Supervision     General bed mobility comments: Good technique no assistance needed.  Supervision for safety.    Transfers Overall transfer level: Needs assistance Equipment used: Rolling walker (2 wheeled) Transfers: Sit to/from Stand Sit to Stand: Supervision         General transfer comment: Cues for UE placement   Ambulation/Gait Ambulation/Gait assistance: Min guard Ambulation Distance (Feet): 300 Feet Assistive device: Rolling walker (2 wheeled) Gait Pattern/deviations: Step-through pattern;Trunk flexed;Decreased stride length Gait velocity: WNL Gait velocity interpretation: at or above normal speed for age/gender General Gait Details: Safety cues for appropriate use of RW as pt was picking RW up during  gait. Pt continued to take hands off of RW, however, with cuing demonstrated safe technique. No LOB noted. Distance limited in afternoon session secondary to pain    Stairs            Wheelchair Mobility    Modified Rankin (Stroke Patients Only)       Balance Overall balance assessment: Needs assistance Sitting-balance support: No upper extremity supported;Feet supported Sitting balance-Leahy Scale: Good     Standing balance support: No upper extremity supported;Bilateral upper extremity supported;During functional activity Standing balance-Leahy Scale: Fair Standing balance comment: Able to statically stand at EOB without support                             Cognition Arousal/Alertness: Awake/alert Behavior During Therapy: WFL for tasks assessed/performed Overall Cognitive Status: Within Functional Limits for tasks assessed                                        Exercises Total Joint Exercises Long Arc Quad: AROM;10 reps;Seated Knee Flexion: AROM;10 reps;Seated    General Comments        Pertinent Vitals/Pain Pain Assessment: Faces Faces Pain Scale: Hurts even more Pain Location: L knee Pain Descriptors / Indicators: Constant;Aching;Operative site guarding Pain Intervention(s): Limited activity within patient's tolerance;Monitored during session;Repositioned    Home Living                      Prior Function            PT Goals (current  goals can now be found in the care plan section) Acute Rehab PT Goals Patient Stated Goal: to decrease pain  PT Goal Formulation: With patient Time For Goal Achievement: 12/31/16 Potential to Achieve Goals: Good Progress towards PT goals: Progressing toward goals    Frequency    7X/week      PT Plan Current plan remains appropriate    Co-evaluation              AM-PAC PT "6 Clicks" Daily Activity  Outcome Measure  Difficulty turning over in bed (including  adjusting bedclothes, sheets and blankets)?: A Little Difficulty moving from lying on back to sitting on the side of the bed? : A Little Difficulty sitting down on and standing up from a chair with arms (e.g., wheelchair, bedside commode, etc,.)?: A Little Help needed moving to and from a bed to chair (including a wheelchair)?: A Little Help needed walking in hospital room?: A Little Help needed climbing 3-5 steps with a railing? : A Little 6 Click Score: 18    End of Session Equipment Utilized During Treatment: Gait belt Activity Tolerance: Patient limited by pain Patient left: in bed;with call bell/phone within reach;Other (comment) (in CPM ) Nurse Communication: Mobility status PT Visit Diagnosis: Other abnormalities of gait and mobility (R26.89);Pain Pain - Right/Left: Left Pain - part of body: Knee     Time: 9562-1308 PT Time Calculation (min) (ACUTE ONLY): 14 min  Charges:  $Gait Training: 8-22 mins                    G Codes:  Functional Assessment Tool Used: AM-PAC 6 Clicks Basic Mobility Functional Limitation: Mobility: Walking and moving around Mobility: Walking and Moving Around Current Status (M5784): At least 40 percent but less than 60 percent impaired, limited or restricted Mobility: Walking and Moving Around Goal Status [phone]): At least 1 percent but less than 20 percent impaired, limited or restricted    Margot Chimes, PT, DPT  Acute Rehabilitation Services  Pager: [phone]    Melvyn Novas 12/25/2016, 2:38 PM

## 2016-12-26 DIAGNOSIS — Z471 Aftercare following joint replacement surgery: Secondary | ICD-10-CM | POA: Diagnosis not present

## 2016-12-26 DIAGNOSIS — I10 Essential (primary) hypertension: Secondary | ICD-10-CM | POA: Diagnosis not present

## 2016-12-26 DIAGNOSIS — M797 Fibromyalgia: Secondary | ICD-10-CM | POA: Diagnosis not present

## 2016-12-26 DIAGNOSIS — F329 Major depressive disorder, single episode, unspecified: Secondary | ICD-10-CM | POA: Diagnosis not present

## 2016-12-26 DIAGNOSIS — E119 Type 2 diabetes mellitus without complications: Secondary | ICD-10-CM | POA: Diagnosis not present

## 2016-12-26 DIAGNOSIS — J45909 Unspecified asthma, uncomplicated: Secondary | ICD-10-CM | POA: Diagnosis not present

## 2016-12-28 DIAGNOSIS — J45909 Unspecified asthma, uncomplicated: Secondary | ICD-10-CM | POA: Diagnosis not present

## 2016-12-28 DIAGNOSIS — M797 Fibromyalgia: Secondary | ICD-10-CM | POA: Diagnosis not present

## 2016-12-28 DIAGNOSIS — I10 Essential (primary) hypertension: Secondary | ICD-10-CM | POA: Diagnosis not present

## 2016-12-28 DIAGNOSIS — Z471 Aftercare following joint replacement surgery: Secondary | ICD-10-CM | POA: Diagnosis not present

## 2016-12-28 DIAGNOSIS — F329 Major depressive disorder, single episode, unspecified: Secondary | ICD-10-CM | POA: Diagnosis not present

## 2016-12-28 DIAGNOSIS — E119 Type 2 diabetes mellitus without complications: Secondary | ICD-10-CM | POA: Diagnosis not present

## 2016-12-29 DIAGNOSIS — I10 Essential (primary) hypertension: Secondary | ICD-10-CM | POA: Diagnosis not present

## 2016-12-29 DIAGNOSIS — M797 Fibromyalgia: Secondary | ICD-10-CM | POA: Diagnosis not present

## 2016-12-29 DIAGNOSIS — Z471 Aftercare following joint replacement surgery: Secondary | ICD-10-CM | POA: Diagnosis not present

## 2016-12-29 DIAGNOSIS — E119 Type 2 diabetes mellitus without complications: Secondary | ICD-10-CM | POA: Diagnosis not present

## 2016-12-29 DIAGNOSIS — J45909 Unspecified asthma, uncomplicated: Secondary | ICD-10-CM | POA: Diagnosis not present

## 2016-12-29 DIAGNOSIS — F329 Major depressive disorder, single episode, unspecified: Secondary | ICD-10-CM | POA: Diagnosis not present

## 2016-12-31 DIAGNOSIS — Z471 Aftercare following joint replacement surgery: Secondary | ICD-10-CM | POA: Diagnosis not present

## 2016-12-31 DIAGNOSIS — M797 Fibromyalgia: Secondary | ICD-10-CM | POA: Diagnosis not present

## 2016-12-31 DIAGNOSIS — I10 Essential (primary) hypertension: Secondary | ICD-10-CM | POA: Diagnosis not present

## 2016-12-31 DIAGNOSIS — F329 Major depressive disorder, single episode, unspecified: Secondary | ICD-10-CM | POA: Diagnosis not present

## 2016-12-31 DIAGNOSIS — J45909 Unspecified asthma, uncomplicated: Secondary | ICD-10-CM | POA: Diagnosis not present

## 2016-12-31 DIAGNOSIS — E119 Type 2 diabetes mellitus without complications: Secondary | ICD-10-CM | POA: Diagnosis not present

## 2017-01-01 DIAGNOSIS — M797 Fibromyalgia: Secondary | ICD-10-CM | POA: Diagnosis not present

## 2017-01-01 DIAGNOSIS — G894 Chronic pain syndrome: Secondary | ICD-10-CM | POA: Diagnosis not present

## 2017-01-01 DIAGNOSIS — G4730 Sleep apnea, unspecified: Secondary | ICD-10-CM | POA: Diagnosis not present

## 2017-01-01 DIAGNOSIS — M5136 Other intervertebral disc degeneration, lumbar region: Secondary | ICD-10-CM | POA: Diagnosis not present

## 2017-01-01 DIAGNOSIS — F329 Major depressive disorder, single episode, unspecified: Secondary | ICD-10-CM | POA: Diagnosis not present

## 2017-01-01 DIAGNOSIS — M179 Osteoarthritis of knee, unspecified: Secondary | ICD-10-CM | POA: Diagnosis not present

## 2017-01-01 DIAGNOSIS — Z471 Aftercare following joint replacement surgery: Secondary | ICD-10-CM | POA: Diagnosis not present

## 2017-01-01 DIAGNOSIS — F1120 Opioid dependence, uncomplicated: Secondary | ICD-10-CM | POA: Diagnosis not present

## 2017-01-01 DIAGNOSIS — Z79891 Long term (current) use of opiate analgesic: Secondary | ICD-10-CM | POA: Diagnosis not present

## 2017-01-01 DIAGNOSIS — M47816 Spondylosis without myelopathy or radiculopathy, lumbar region: Secondary | ICD-10-CM | POA: Diagnosis not present

## 2017-01-01 DIAGNOSIS — J45909 Unspecified asthma, uncomplicated: Secondary | ICD-10-CM | POA: Diagnosis not present

## 2017-01-01 DIAGNOSIS — Z96651 Presence of right artificial knee joint: Secondary | ICD-10-CM | POA: Diagnosis not present

## 2017-01-01 DIAGNOSIS — I10 Essential (primary) hypertension: Secondary | ICD-10-CM | POA: Diagnosis not present

## 2017-01-01 DIAGNOSIS — M4696 Unspecified inflammatory spondylopathy, lumbar region: Secondary | ICD-10-CM | POA: Diagnosis not present

## 2017-01-01 DIAGNOSIS — E119 Type 2 diabetes mellitus without complications: Secondary | ICD-10-CM | POA: Diagnosis not present

## 2017-01-02 DIAGNOSIS — Z471 Aftercare following joint replacement surgery: Secondary | ICD-10-CM | POA: Diagnosis not present

## 2017-01-02 DIAGNOSIS — Z96652 Presence of left artificial knee joint: Secondary | ICD-10-CM | POA: Diagnosis not present

## 2017-01-02 DIAGNOSIS — M25462 Effusion, left knee: Secondary | ICD-10-CM | POA: Diagnosis not present

## 2017-01-05 DIAGNOSIS — G4733 Obstructive sleep apnea (adult) (pediatric): Secondary | ICD-10-CM | POA: Diagnosis not present

## 2017-01-09 DIAGNOSIS — Z96653 Presence of artificial knee joint, bilateral: Secondary | ICD-10-CM | POA: Diagnosis not present

## 2017-01-09 DIAGNOSIS — M25562 Pain in left knee: Secondary | ICD-10-CM | POA: Diagnosis not present

## 2017-01-09 DIAGNOSIS — M25662 Stiffness of left knee, not elsewhere classified: Secondary | ICD-10-CM | POA: Diagnosis not present

## 2017-01-09 DIAGNOSIS — R2689 Other abnormalities of gait and mobility: Secondary | ICD-10-CM | POA: Diagnosis not present

## 2017-01-16 DIAGNOSIS — M25662 Stiffness of left knee, not elsewhere classified: Secondary | ICD-10-CM | POA: Diagnosis not present

## 2017-01-16 DIAGNOSIS — M25562 Pain in left knee: Secondary | ICD-10-CM | POA: Diagnosis not present

## 2017-01-16 DIAGNOSIS — R2689 Other abnormalities of gait and mobility: Secondary | ICD-10-CM | POA: Diagnosis not present

## 2017-01-16 DIAGNOSIS — Z96653 Presence of artificial knee joint, bilateral: Secondary | ICD-10-CM | POA: Diagnosis not present

## 2017-01-17 DIAGNOSIS — G4733 Obstructive sleep apnea (adult) (pediatric): Secondary | ICD-10-CM | POA: Diagnosis not present

## 2017-01-18 DIAGNOSIS — G4733 Obstructive sleep apnea (adult) (pediatric): Secondary | ICD-10-CM | POA: Diagnosis not present

## 2017-01-21 DIAGNOSIS — J449 Chronic obstructive pulmonary disease, unspecified: Secondary | ICD-10-CM | POA: Diagnosis not present

## 2017-01-21 DIAGNOSIS — G4733 Obstructive sleep apnea (adult) (pediatric): Secondary | ICD-10-CM | POA: Diagnosis not present

## 2017-01-21 DIAGNOSIS — R918 Other nonspecific abnormal finding of lung field: Secondary | ICD-10-CM | POA: Diagnosis not present

## 2017-01-21 DIAGNOSIS — J310 Chronic rhinitis: Secondary | ICD-10-CM | POA: Diagnosis not present

## 2017-01-21 DIAGNOSIS — R5383 Other fatigue: Secondary | ICD-10-CM | POA: Diagnosis not present

## 2017-01-22 DIAGNOSIS — R2689 Other abnormalities of gait and mobility: Secondary | ICD-10-CM | POA: Diagnosis not present

## 2017-01-22 DIAGNOSIS — M25662 Stiffness of left knee, not elsewhere classified: Secondary | ICD-10-CM | POA: Diagnosis not present

## 2017-01-22 DIAGNOSIS — Z96653 Presence of artificial knee joint, bilateral: Secondary | ICD-10-CM | POA: Diagnosis not present

## 2017-01-22 DIAGNOSIS — M25562 Pain in left knee: Secondary | ICD-10-CM | POA: Diagnosis not present

## 2017-01-29 DIAGNOSIS — M5136 Other intervertebral disc degeneration, lumbar region: Secondary | ICD-10-CM | POA: Diagnosis not present

## 2017-01-29 DIAGNOSIS — F1120 Opioid dependence, uncomplicated: Secondary | ICD-10-CM | POA: Diagnosis not present

## 2017-01-29 DIAGNOSIS — Z79891 Long term (current) use of opiate analgesic: Secondary | ICD-10-CM | POA: Diagnosis not present

## 2017-01-29 DIAGNOSIS — G894 Chronic pain syndrome: Secondary | ICD-10-CM | POA: Diagnosis not present

## 2017-01-29 DIAGNOSIS — M179 Osteoarthritis of knee, unspecified: Secondary | ICD-10-CM | POA: Diagnosis not present

## 2017-01-29 DIAGNOSIS — G4730 Sleep apnea, unspecified: Secondary | ICD-10-CM | POA: Diagnosis not present

## 2017-01-29 DIAGNOSIS — M47816 Spondylosis without myelopathy or radiculopathy, lumbar region: Secondary | ICD-10-CM | POA: Diagnosis not present

## 2017-01-29 DIAGNOSIS — M4696 Unspecified inflammatory spondylopathy, lumbar region: Secondary | ICD-10-CM | POA: Diagnosis not present

## 2017-01-29 DIAGNOSIS — Z96651 Presence of right artificial knee joint: Secondary | ICD-10-CM | POA: Diagnosis not present

## 2017-01-30 DIAGNOSIS — R2689 Other abnormalities of gait and mobility: Secondary | ICD-10-CM | POA: Diagnosis not present

## 2017-01-30 DIAGNOSIS — M25562 Pain in left knee: Secondary | ICD-10-CM | POA: Diagnosis not present

## 2017-01-30 DIAGNOSIS — Z96653 Presence of artificial knee joint, bilateral: Secondary | ICD-10-CM | POA: Diagnosis not present

## 2017-01-30 DIAGNOSIS — M25662 Stiffness of left knee, not elsewhere classified: Secondary | ICD-10-CM | POA: Diagnosis not present

## 2017-02-04 DIAGNOSIS — G4733 Obstructive sleep apnea (adult) (pediatric): Secondary | ICD-10-CM | POA: Diagnosis not present

## 2017-02-06 DIAGNOSIS — M25562 Pain in left knee: Secondary | ICD-10-CM | POA: Diagnosis not present

## 2017-02-06 DIAGNOSIS — M25662 Stiffness of left knee, not elsewhere classified: Secondary | ICD-10-CM | POA: Diagnosis not present

## 2017-02-06 DIAGNOSIS — R2689 Other abnormalities of gait and mobility: Secondary | ICD-10-CM | POA: Diagnosis not present

## 2017-02-06 DIAGNOSIS — Z96653 Presence of artificial knee joint, bilateral: Secondary | ICD-10-CM | POA: Diagnosis not present

## 2017-02-12 DIAGNOSIS — Z96653 Presence of artificial knee joint, bilateral: Secondary | ICD-10-CM | POA: Diagnosis not present

## 2017-02-12 DIAGNOSIS — M25662 Stiffness of left knee, not elsewhere classified: Secondary | ICD-10-CM | POA: Diagnosis not present

## 2017-02-12 DIAGNOSIS — M25562 Pain in left knee: Secondary | ICD-10-CM | POA: Diagnosis not present

## 2017-02-12 DIAGNOSIS — R2689 Other abnormalities of gait and mobility: Secondary | ICD-10-CM | POA: Diagnosis not present

## 2017-02-19 DIAGNOSIS — M25662 Stiffness of left knee, not elsewhere classified: Secondary | ICD-10-CM | POA: Diagnosis not present

## 2017-02-19 DIAGNOSIS — M25562 Pain in left knee: Secondary | ICD-10-CM | POA: Diagnosis not present

## 2017-02-19 DIAGNOSIS — R2689 Other abnormalities of gait and mobility: Secondary | ICD-10-CM | POA: Diagnosis not present

## 2017-02-19 DIAGNOSIS — Z96653 Presence of artificial knee joint, bilateral: Secondary | ICD-10-CM | POA: Diagnosis not present

## 2017-02-25 DIAGNOSIS — F1120 Opioid dependence, uncomplicated: Secondary | ICD-10-CM | POA: Diagnosis not present

## 2017-02-25 DIAGNOSIS — M5136 Other intervertebral disc degeneration, lumbar region: Secondary | ICD-10-CM | POA: Diagnosis not present

## 2017-02-25 DIAGNOSIS — Z96651 Presence of right artificial knee joint: Secondary | ICD-10-CM | POA: Diagnosis not present

## 2017-02-25 DIAGNOSIS — G894 Chronic pain syndrome: Secondary | ICD-10-CM | POA: Diagnosis not present

## 2017-02-25 DIAGNOSIS — M4696 Unspecified inflammatory spondylopathy, lumbar region: Secondary | ICD-10-CM | POA: Diagnosis not present

## 2017-02-25 DIAGNOSIS — M47816 Spondylosis without myelopathy or radiculopathy, lumbar region: Secondary | ICD-10-CM | POA: Diagnosis not present

## 2017-02-25 DIAGNOSIS — M179 Osteoarthritis of knee, unspecified: Secondary | ICD-10-CM | POA: Diagnosis not present

## 2017-02-25 DIAGNOSIS — Z79891 Long term (current) use of opiate analgesic: Secondary | ICD-10-CM | POA: Diagnosis not present

## 2017-02-25 DIAGNOSIS — G4730 Sleep apnea, unspecified: Secondary | ICD-10-CM | POA: Diagnosis not present

## 2017-02-25 DIAGNOSIS — M791 Myalgia: Secondary | ICD-10-CM | POA: Diagnosis not present

## 2017-02-26 DIAGNOSIS — M25662 Stiffness of left knee, not elsewhere classified: Secondary | ICD-10-CM | POA: Diagnosis not present

## 2017-02-26 DIAGNOSIS — R2689 Other abnormalities of gait and mobility: Secondary | ICD-10-CM | POA: Diagnosis not present

## 2017-02-26 DIAGNOSIS — M25562 Pain in left knee: Secondary | ICD-10-CM | POA: Diagnosis not present

## 2017-02-26 DIAGNOSIS — Z96653 Presence of artificial knee joint, bilateral: Secondary | ICD-10-CM | POA: Diagnosis not present

## 2017-02-28 DIAGNOSIS — G4730 Sleep apnea, unspecified: Secondary | ICD-10-CM | POA: Diagnosis not present

## 2017-02-28 DIAGNOSIS — R918 Other nonspecific abnormal finding of lung field: Secondary | ICD-10-CM | POA: Diagnosis not present

## 2017-02-28 DIAGNOSIS — J449 Chronic obstructive pulmonary disease, unspecified: Secondary | ICD-10-CM | POA: Diagnosis not present

## 2017-02-28 DIAGNOSIS — J310 Chronic rhinitis: Secondary | ICD-10-CM | POA: Diagnosis not present

## 2017-03-05 DIAGNOSIS — Z96653 Presence of artificial knee joint, bilateral: Secondary | ICD-10-CM | POA: Diagnosis not present

## 2017-03-05 DIAGNOSIS — R2689 Other abnormalities of gait and mobility: Secondary | ICD-10-CM | POA: Diagnosis not present

## 2017-03-05 DIAGNOSIS — M25662 Stiffness of left knee, not elsewhere classified: Secondary | ICD-10-CM | POA: Diagnosis not present

## 2017-03-05 DIAGNOSIS — M25562 Pain in left knee: Secondary | ICD-10-CM | POA: Diagnosis not present

## 2017-03-06 DIAGNOSIS — Z6828 Body mass index (BMI) 28.0-28.9, adult: Secondary | ICD-10-CM | POA: Diagnosis not present

## 2017-03-06 DIAGNOSIS — H8110 Benign paroxysmal vertigo, unspecified ear: Secondary | ICD-10-CM | POA: Diagnosis not present

## 2017-04-01 DIAGNOSIS — Z79891 Long term (current) use of opiate analgesic: Secondary | ICD-10-CM | POA: Diagnosis not present

## 2017-04-01 DIAGNOSIS — F1120 Opioid dependence, uncomplicated: Secondary | ICD-10-CM | POA: Diagnosis not present

## 2017-04-01 DIAGNOSIS — G894 Chronic pain syndrome: Secondary | ICD-10-CM | POA: Diagnosis not present

## 2017-04-01 DIAGNOSIS — G4730 Sleep apnea, unspecified: Secondary | ICD-10-CM | POA: Diagnosis not present

## 2017-04-01 DIAGNOSIS — M47816 Spondylosis without myelopathy or radiculopathy, lumbar region: Secondary | ICD-10-CM | POA: Diagnosis not present

## 2017-04-01 DIAGNOSIS — M179 Osteoarthritis of knee, unspecified: Secondary | ICD-10-CM | POA: Diagnosis not present

## 2017-04-01 DIAGNOSIS — Z96651 Presence of right artificial knee joint: Secondary | ICD-10-CM | POA: Diagnosis not present

## 2017-04-01 DIAGNOSIS — M791 Myalgia: Secondary | ICD-10-CM | POA: Diagnosis not present

## 2017-04-01 DIAGNOSIS — M5136 Other intervertebral disc degeneration, lumbar region: Secondary | ICD-10-CM | POA: Diagnosis not present

## 2017-04-17 DIAGNOSIS — G4730 Sleep apnea, unspecified: Secondary | ICD-10-CM | POA: Diagnosis not present

## 2017-04-17 DIAGNOSIS — J449 Chronic obstructive pulmonary disease, unspecified: Secondary | ICD-10-CM | POA: Diagnosis not present

## 2017-04-24 DIAGNOSIS — M7918 Myalgia, other site: Secondary | ICD-10-CM | POA: Diagnosis not present

## 2017-04-24 DIAGNOSIS — G894 Chronic pain syndrome: Secondary | ICD-10-CM | POA: Diagnosis not present

## 2017-04-24 DIAGNOSIS — Z79891 Long term (current) use of opiate analgesic: Secondary | ICD-10-CM | POA: Diagnosis not present

## 2017-04-24 DIAGNOSIS — F1120 Opioid dependence, uncomplicated: Secondary | ICD-10-CM | POA: Diagnosis not present

## 2017-04-24 DIAGNOSIS — Z96651 Presence of right artificial knee joint: Secondary | ICD-10-CM | POA: Diagnosis not present

## 2017-04-24 DIAGNOSIS — G4730 Sleep apnea, unspecified: Secondary | ICD-10-CM | POA: Diagnosis not present

## 2017-04-24 DIAGNOSIS — M47816 Spondylosis without myelopathy or radiculopathy, lumbar region: Secondary | ICD-10-CM | POA: Diagnosis not present

## 2017-04-24 DIAGNOSIS — M179 Osteoarthritis of knee, unspecified: Secondary | ICD-10-CM | POA: Diagnosis not present

## 2017-04-24 DIAGNOSIS — M5136 Other intervertebral disc degeneration, lumbar region: Secondary | ICD-10-CM | POA: Diagnosis not present

## 2017-04-25 DIAGNOSIS — D500 Iron deficiency anemia secondary to blood loss (chronic): Secondary | ICD-10-CM | POA: Diagnosis not present

## 2017-04-29 DIAGNOSIS — M5136 Other intervertebral disc degeneration, lumbar region: Secondary | ICD-10-CM | POA: Diagnosis not present

## 2017-04-29 DIAGNOSIS — M179 Osteoarthritis of knee, unspecified: Secondary | ICD-10-CM | POA: Diagnosis not present

## 2017-04-29 DIAGNOSIS — G894 Chronic pain syndrome: Secondary | ICD-10-CM | POA: Diagnosis not present

## 2017-04-29 DIAGNOSIS — Z96651 Presence of right artificial knee joint: Secondary | ICD-10-CM | POA: Diagnosis not present

## 2017-04-29 DIAGNOSIS — G4730 Sleep apnea, unspecified: Secondary | ICD-10-CM | POA: Diagnosis not present

## 2017-04-29 DIAGNOSIS — M47816 Spondylosis without myelopathy or radiculopathy, lumbar region: Secondary | ICD-10-CM | POA: Diagnosis not present

## 2017-04-29 DIAGNOSIS — F1120 Opioid dependence, uncomplicated: Secondary | ICD-10-CM | POA: Diagnosis not present

## 2017-04-29 DIAGNOSIS — M4696 Unspecified inflammatory spondylopathy, lumbar region: Secondary | ICD-10-CM | POA: Diagnosis not present

## 2017-04-29 DIAGNOSIS — Z79891 Long term (current) use of opiate analgesic: Secondary | ICD-10-CM | POA: Diagnosis not present

## 2017-04-30 DIAGNOSIS — K2960 Other gastritis without bleeding: Secondary | ICD-10-CM | POA: Diagnosis not present

## 2017-04-30 DIAGNOSIS — K589 Irritable bowel syndrome without diarrhea: Secondary | ICD-10-CM | POA: Diagnosis not present

## 2017-04-30 DIAGNOSIS — D500 Iron deficiency anemia secondary to blood loss (chronic): Secondary | ICD-10-CM | POA: Diagnosis not present

## 2017-05-09 ENCOUNTER — Encounter (HOSPITAL_COMMUNITY): Payer: Self-pay | Admitting: Orthopedic Surgery

## 2017-05-27 DIAGNOSIS — E119 Type 2 diabetes mellitus without complications: Secondary | ICD-10-CM | POA: Diagnosis not present

## 2017-05-27 DIAGNOSIS — H524 Presbyopia: Secondary | ICD-10-CM | POA: Diagnosis not present

## 2017-05-30 DIAGNOSIS — Z716 Tobacco abuse counseling: Secondary | ICD-10-CM | POA: Diagnosis not present

## 2017-05-30 DIAGNOSIS — Z0000 Encounter for general adult medical examination without abnormal findings: Secondary | ICD-10-CM | POA: Diagnosis not present

## 2017-05-30 DIAGNOSIS — E119 Type 2 diabetes mellitus without complications: Secondary | ICD-10-CM | POA: Diagnosis not present

## 2017-05-30 DIAGNOSIS — Z6828 Body mass index (BMI) 28.0-28.9, adult: Secondary | ICD-10-CM | POA: Diagnosis not present

## 2017-05-30 DIAGNOSIS — Z1339 Encounter for screening examination for other mental health and behavioral disorders: Secondary | ICD-10-CM | POA: Diagnosis not present

## 2017-05-30 DIAGNOSIS — M899 Disorder of bone, unspecified: Secondary | ICD-10-CM | POA: Diagnosis not present

## 2017-05-30 DIAGNOSIS — Z780 Asymptomatic menopausal state: Secondary | ICD-10-CM | POA: Diagnosis not present

## 2017-05-31 DIAGNOSIS — Z96651 Presence of right artificial knee joint: Secondary | ICD-10-CM | POA: Diagnosis not present

## 2017-05-31 DIAGNOSIS — M47816 Spondylosis without myelopathy or radiculopathy, lumbar region: Secondary | ICD-10-CM | POA: Diagnosis not present

## 2017-05-31 DIAGNOSIS — Z720 Tobacco use: Secondary | ICD-10-CM | POA: Diagnosis not present

## 2017-05-31 DIAGNOSIS — F1120 Opioid dependence, uncomplicated: Secondary | ICD-10-CM | POA: Diagnosis not present

## 2017-05-31 DIAGNOSIS — M179 Osteoarthritis of knee, unspecified: Secondary | ICD-10-CM | POA: Diagnosis not present

## 2017-05-31 DIAGNOSIS — G4730 Sleep apnea, unspecified: Secondary | ICD-10-CM | POA: Diagnosis not present

## 2017-05-31 DIAGNOSIS — Z96652 Presence of left artificial knee joint: Secondary | ICD-10-CM | POA: Diagnosis not present

## 2017-05-31 DIAGNOSIS — M5136 Other intervertebral disc degeneration, lumbar region: Secondary | ICD-10-CM | POA: Diagnosis not present

## 2017-05-31 DIAGNOSIS — G894 Chronic pain syndrome: Secondary | ICD-10-CM | POA: Diagnosis not present

## 2017-05-31 DIAGNOSIS — Z79891 Long term (current) use of opiate analgesic: Secondary | ICD-10-CM | POA: Diagnosis not present

## 2017-06-27 DIAGNOSIS — Z96652 Presence of left artificial knee joint: Secondary | ICD-10-CM | POA: Diagnosis not present

## 2017-06-27 DIAGNOSIS — Z96651 Presence of right artificial knee joint: Secondary | ICD-10-CM | POA: Diagnosis not present

## 2017-06-27 DIAGNOSIS — B1920 Unspecified viral hepatitis C without hepatic coma: Secondary | ICD-10-CM | POA: Diagnosis not present

## 2017-06-27 DIAGNOSIS — M47816 Spondylosis without myelopathy or radiculopathy, lumbar region: Secondary | ICD-10-CM | POA: Diagnosis not present

## 2017-06-27 DIAGNOSIS — M179 Osteoarthritis of knee, unspecified: Secondary | ICD-10-CM | POA: Diagnosis not present

## 2017-06-27 DIAGNOSIS — F1120 Opioid dependence, uncomplicated: Secondary | ICD-10-CM | POA: Diagnosis not present

## 2017-06-27 DIAGNOSIS — G894 Chronic pain syndrome: Secondary | ICD-10-CM | POA: Diagnosis not present

## 2017-06-27 DIAGNOSIS — Z79891 Long term (current) use of opiate analgesic: Secondary | ICD-10-CM | POA: Diagnosis not present

## 2017-06-27 DIAGNOSIS — J449 Chronic obstructive pulmonary disease, unspecified: Secondary | ICD-10-CM | POA: Diagnosis not present

## 2017-07-03 DIAGNOSIS — Z96652 Presence of left artificial knee joint: Secondary | ICD-10-CM | POA: Diagnosis not present

## 2017-07-03 DIAGNOSIS — B1920 Unspecified viral hepatitis C without hepatic coma: Secondary | ICD-10-CM | POA: Diagnosis not present

## 2017-07-03 DIAGNOSIS — Z96651 Presence of right artificial knee joint: Secondary | ICD-10-CM | POA: Diagnosis not present

## 2017-07-03 DIAGNOSIS — F1120 Opioid dependence, uncomplicated: Secondary | ICD-10-CM | POA: Diagnosis not present

## 2017-07-03 DIAGNOSIS — G894 Chronic pain syndrome: Secondary | ICD-10-CM | POA: Diagnosis not present

## 2017-07-03 DIAGNOSIS — M47816 Spondylosis without myelopathy or radiculopathy, lumbar region: Secondary | ICD-10-CM | POA: Diagnosis not present

## 2017-07-03 DIAGNOSIS — J449 Chronic obstructive pulmonary disease, unspecified: Secondary | ICD-10-CM | POA: Diagnosis not present

## 2017-07-03 DIAGNOSIS — Z79891 Long term (current) use of opiate analgesic: Secondary | ICD-10-CM | POA: Diagnosis not present

## 2017-07-03 DIAGNOSIS — M1712 Unilateral primary osteoarthritis, left knee: Secondary | ICD-10-CM | POA: Diagnosis not present

## 2017-07-09 DIAGNOSIS — R918 Other nonspecific abnormal finding of lung field: Secondary | ICD-10-CM | POA: Diagnosis not present

## 2017-07-09 DIAGNOSIS — J310 Chronic rhinitis: Secondary | ICD-10-CM | POA: Diagnosis not present

## 2017-07-09 DIAGNOSIS — G4733 Obstructive sleep apnea (adult) (pediatric): Secondary | ICD-10-CM | POA: Diagnosis not present

## 2017-07-09 DIAGNOSIS — J4540 Moderate persistent asthma, uncomplicated: Secondary | ICD-10-CM | POA: Diagnosis not present

## 2017-07-09 DIAGNOSIS — R5383 Other fatigue: Secondary | ICD-10-CM | POA: Diagnosis not present

## 2017-07-31 DIAGNOSIS — Z96651 Presence of right artificial knee joint: Secondary | ICD-10-CM | POA: Diagnosis not present

## 2017-07-31 DIAGNOSIS — F1120 Opioid dependence, uncomplicated: Secondary | ICD-10-CM | POA: Diagnosis not present

## 2017-07-31 DIAGNOSIS — G894 Chronic pain syndrome: Secondary | ICD-10-CM | POA: Diagnosis not present

## 2017-07-31 DIAGNOSIS — B1920 Unspecified viral hepatitis C without hepatic coma: Secondary | ICD-10-CM | POA: Diagnosis not present

## 2017-07-31 DIAGNOSIS — Z79891 Long term (current) use of opiate analgesic: Secondary | ICD-10-CM | POA: Diagnosis not present

## 2017-07-31 DIAGNOSIS — M47816 Spondylosis without myelopathy or radiculopathy, lumbar region: Secondary | ICD-10-CM | POA: Diagnosis not present

## 2017-07-31 DIAGNOSIS — J449 Chronic obstructive pulmonary disease, unspecified: Secondary | ICD-10-CM | POA: Diagnosis not present

## 2017-07-31 DIAGNOSIS — Z96652 Presence of left artificial knee joint: Secondary | ICD-10-CM | POA: Diagnosis not present

## 2017-07-31 DIAGNOSIS — M1990 Unspecified osteoarthritis, unspecified site: Secondary | ICD-10-CM | POA: Diagnosis not present

## 2017-08-28 DIAGNOSIS — B1920 Unspecified viral hepatitis C without hepatic coma: Secondary | ICD-10-CM | POA: Diagnosis not present

## 2017-08-28 DIAGNOSIS — Z96651 Presence of right artificial knee joint: Secondary | ICD-10-CM | POA: Diagnosis not present

## 2017-08-28 DIAGNOSIS — F1120 Opioid dependence, uncomplicated: Secondary | ICD-10-CM | POA: Diagnosis not present

## 2017-08-28 DIAGNOSIS — G894 Chronic pain syndrome: Secondary | ICD-10-CM | POA: Diagnosis not present

## 2017-08-28 DIAGNOSIS — Z96652 Presence of left artificial knee joint: Secondary | ICD-10-CM | POA: Diagnosis not present

## 2017-08-28 DIAGNOSIS — M1990 Unspecified osteoarthritis, unspecified site: Secondary | ICD-10-CM | POA: Diagnosis not present

## 2017-08-28 DIAGNOSIS — Z79891 Long term (current) use of opiate analgesic: Secondary | ICD-10-CM | POA: Diagnosis not present

## 2017-08-28 DIAGNOSIS — M47816 Spondylosis without myelopathy or radiculopathy, lumbar region: Secondary | ICD-10-CM | POA: Diagnosis not present

## 2017-08-28 DIAGNOSIS — J449 Chronic obstructive pulmonary disease, unspecified: Secondary | ICD-10-CM | POA: Diagnosis not present

## 2017-09-03 DIAGNOSIS — J441 Chronic obstructive pulmonary disease with (acute) exacerbation: Secondary | ICD-10-CM | POA: Diagnosis not present

## 2017-09-03 DIAGNOSIS — Z6829 Body mass index (BMI) 29.0-29.9, adult: Secondary | ICD-10-CM | POA: Diagnosis not present

## 2017-09-10 DIAGNOSIS — H26491 Other secondary cataract, right eye: Secondary | ICD-10-CM | POA: Diagnosis not present

## 2017-09-25 DIAGNOSIS — Z96651 Presence of right artificial knee joint: Secondary | ICD-10-CM | POA: Diagnosis not present

## 2017-09-25 DIAGNOSIS — B1920 Unspecified viral hepatitis C without hepatic coma: Secondary | ICD-10-CM | POA: Diagnosis not present

## 2017-09-25 DIAGNOSIS — M47816 Spondylosis without myelopathy or radiculopathy, lumbar region: Secondary | ICD-10-CM | POA: Diagnosis not present

## 2017-09-25 DIAGNOSIS — G894 Chronic pain syndrome: Secondary | ICD-10-CM | POA: Diagnosis not present

## 2017-09-25 DIAGNOSIS — Z96652 Presence of left artificial knee joint: Secondary | ICD-10-CM | POA: Diagnosis not present

## 2017-09-25 DIAGNOSIS — F1120 Opioid dependence, uncomplicated: Secondary | ICD-10-CM | POA: Diagnosis not present

## 2017-09-25 DIAGNOSIS — Z79891 Long term (current) use of opiate analgesic: Secondary | ICD-10-CM | POA: Diagnosis not present

## 2017-09-25 DIAGNOSIS — J449 Chronic obstructive pulmonary disease, unspecified: Secondary | ICD-10-CM | POA: Diagnosis not present

## 2017-09-25 DIAGNOSIS — M1990 Unspecified osteoarthritis, unspecified site: Secondary | ICD-10-CM | POA: Diagnosis not present

## 2017-09-26 DIAGNOSIS — M179 Osteoarthritis of knee, unspecified: Secondary | ICD-10-CM | POA: Diagnosis not present

## 2017-09-26 DIAGNOSIS — M1990 Unspecified osteoarthritis, unspecified site: Secondary | ICD-10-CM | POA: Diagnosis not present

## 2017-09-26 DIAGNOSIS — M47816 Spondylosis without myelopathy or radiculopathy, lumbar region: Secondary | ICD-10-CM | POA: Diagnosis not present

## 2017-09-26 DIAGNOSIS — Z96651 Presence of right artificial knee joint: Secondary | ICD-10-CM | POA: Diagnosis not present

## 2017-09-26 DIAGNOSIS — G894 Chronic pain syndrome: Secondary | ICD-10-CM | POA: Diagnosis not present

## 2017-09-26 DIAGNOSIS — F1120 Opioid dependence, uncomplicated: Secondary | ICD-10-CM | POA: Diagnosis not present

## 2017-09-26 DIAGNOSIS — Z96652 Presence of left artificial knee joint: Secondary | ICD-10-CM | POA: Diagnosis not present

## 2017-09-26 DIAGNOSIS — Z79891 Long term (current) use of opiate analgesic: Secondary | ICD-10-CM | POA: Diagnosis not present

## 2017-09-26 DIAGNOSIS — J449 Chronic obstructive pulmonary disease, unspecified: Secondary | ICD-10-CM | POA: Diagnosis not present

## 2017-09-26 DIAGNOSIS — B1920 Unspecified viral hepatitis C without hepatic coma: Secondary | ICD-10-CM | POA: Diagnosis not present

## 2017-10-15 DIAGNOSIS — J4540 Moderate persistent asthma, uncomplicated: Secondary | ICD-10-CM | POA: Diagnosis not present

## 2017-10-15 DIAGNOSIS — R918 Other nonspecific abnormal finding of lung field: Secondary | ICD-10-CM | POA: Diagnosis not present

## 2017-10-15 DIAGNOSIS — G4730 Sleep apnea, unspecified: Secondary | ICD-10-CM | POA: Diagnosis not present

## 2017-10-15 DIAGNOSIS — H52209 Unspecified astigmatism, unspecified eye: Secondary | ICD-10-CM | POA: Diagnosis not present

## 2017-10-15 DIAGNOSIS — H524 Presbyopia: Secondary | ICD-10-CM | POA: Diagnosis not present

## 2017-10-15 DIAGNOSIS — J310 Chronic rhinitis: Secondary | ICD-10-CM | POA: Diagnosis not present

## 2017-10-15 DIAGNOSIS — R5383 Other fatigue: Secondary | ICD-10-CM | POA: Diagnosis not present

## 2017-10-15 DIAGNOSIS — F17210 Nicotine dependence, cigarettes, uncomplicated: Secondary | ICD-10-CM | POA: Diagnosis not present

## 2017-10-15 DIAGNOSIS — H5203 Hypermetropia, bilateral: Secondary | ICD-10-CM | POA: Diagnosis not present

## 2017-10-16 DIAGNOSIS — J4540 Moderate persistent asthma, uncomplicated: Secondary | ICD-10-CM | POA: Diagnosis not present

## 2017-10-16 DIAGNOSIS — H543 Unqualified visual loss, both eyes: Secondary | ICD-10-CM | POA: Diagnosis not present

## 2017-10-16 DIAGNOSIS — I6523 Occlusion and stenosis of bilateral carotid arteries: Secondary | ICD-10-CM | POA: Diagnosis not present

## 2017-10-16 DIAGNOSIS — D500 Iron deficiency anemia secondary to blood loss (chronic): Secondary | ICD-10-CM | POA: Diagnosis not present

## 2017-10-16 DIAGNOSIS — F17210 Nicotine dependence, cigarettes, uncomplicated: Secondary | ICD-10-CM | POA: Diagnosis not present

## 2017-10-23 DIAGNOSIS — M1990 Unspecified osteoarthritis, unspecified site: Secondary | ICD-10-CM | POA: Diagnosis not present

## 2017-10-23 DIAGNOSIS — Z96652 Presence of left artificial knee joint: Secondary | ICD-10-CM | POA: Diagnosis not present

## 2017-10-23 DIAGNOSIS — F1120 Opioid dependence, uncomplicated: Secondary | ICD-10-CM | POA: Diagnosis not present

## 2017-10-23 DIAGNOSIS — Z79891 Long term (current) use of opiate analgesic: Secondary | ICD-10-CM | POA: Diagnosis not present

## 2017-10-23 DIAGNOSIS — B1920 Unspecified viral hepatitis C without hepatic coma: Secondary | ICD-10-CM | POA: Diagnosis not present

## 2017-10-23 DIAGNOSIS — M47816 Spondylosis without myelopathy or radiculopathy, lumbar region: Secondary | ICD-10-CM | POA: Diagnosis not present

## 2017-10-23 DIAGNOSIS — G894 Chronic pain syndrome: Secondary | ICD-10-CM | POA: Diagnosis not present

## 2017-10-23 DIAGNOSIS — J449 Chronic obstructive pulmonary disease, unspecified: Secondary | ICD-10-CM | POA: Diagnosis not present

## 2017-10-23 DIAGNOSIS — Z96651 Presence of right artificial knee joint: Secondary | ICD-10-CM | POA: Diagnosis not present

## 2017-10-24 DIAGNOSIS — Z96652 Presence of left artificial knee joint: Secondary | ICD-10-CM | POA: Diagnosis not present

## 2017-10-24 DIAGNOSIS — Z79891 Long term (current) use of opiate analgesic: Secondary | ICD-10-CM | POA: Diagnosis not present

## 2017-10-24 DIAGNOSIS — Z96651 Presence of right artificial knee joint: Secondary | ICD-10-CM | POA: Diagnosis not present

## 2017-10-24 DIAGNOSIS — J449 Chronic obstructive pulmonary disease, unspecified: Secondary | ICD-10-CM | POA: Diagnosis not present

## 2017-10-24 DIAGNOSIS — F1120 Opioid dependence, uncomplicated: Secondary | ICD-10-CM | POA: Diagnosis not present

## 2017-10-24 DIAGNOSIS — B1920 Unspecified viral hepatitis C without hepatic coma: Secondary | ICD-10-CM | POA: Diagnosis not present

## 2017-10-24 DIAGNOSIS — M47816 Spondylosis without myelopathy or radiculopathy, lumbar region: Secondary | ICD-10-CM | POA: Diagnosis not present

## 2017-10-24 DIAGNOSIS — M1990 Unspecified osteoarthritis, unspecified site: Secondary | ICD-10-CM | POA: Diagnosis not present

## 2017-10-24 DIAGNOSIS — G894 Chronic pain syndrome: Secondary | ICD-10-CM | POA: Diagnosis not present

## 2017-10-29 DIAGNOSIS — Z1212 Encounter for screening for malignant neoplasm of rectum: Secondary | ICD-10-CM | POA: Diagnosis not present

## 2017-10-29 DIAGNOSIS — K219 Gastro-esophageal reflux disease without esophagitis: Secondary | ICD-10-CM | POA: Diagnosis not present

## 2017-10-29 DIAGNOSIS — K2960 Other gastritis without bleeding: Secondary | ICD-10-CM | POA: Diagnosis not present

## 2017-10-29 DIAGNOSIS — K589 Irritable bowel syndrome without diarrhea: Secondary | ICD-10-CM | POA: Diagnosis not present

## 2017-10-29 DIAGNOSIS — D500 Iron deficiency anemia secondary to blood loss (chronic): Secondary | ICD-10-CM | POA: Diagnosis not present

## 2017-11-05 DIAGNOSIS — Z1231 Encounter for screening mammogram for malignant neoplasm of breast: Secondary | ICD-10-CM | POA: Diagnosis not present

## 2017-11-25 DIAGNOSIS — B1920 Unspecified viral hepatitis C without hepatic coma: Secondary | ICD-10-CM | POA: Diagnosis not present

## 2017-11-25 DIAGNOSIS — Z96651 Presence of right artificial knee joint: Secondary | ICD-10-CM | POA: Diagnosis not present

## 2017-11-25 DIAGNOSIS — Z79891 Long term (current) use of opiate analgesic: Secondary | ICD-10-CM | POA: Diagnosis not present

## 2017-11-25 DIAGNOSIS — M1990 Unspecified osteoarthritis, unspecified site: Secondary | ICD-10-CM | POA: Diagnosis not present

## 2017-11-25 DIAGNOSIS — J449 Chronic obstructive pulmonary disease, unspecified: Secondary | ICD-10-CM | POA: Diagnosis not present

## 2017-11-25 DIAGNOSIS — Z96652 Presence of left artificial knee joint: Secondary | ICD-10-CM | POA: Diagnosis not present

## 2017-11-25 DIAGNOSIS — G894 Chronic pain syndrome: Secondary | ICD-10-CM | POA: Diagnosis not present

## 2017-11-25 DIAGNOSIS — M47816 Spondylosis without myelopathy or radiculopathy, lumbar region: Secondary | ICD-10-CM | POA: Diagnosis not present

## 2017-11-25 DIAGNOSIS — F1120 Opioid dependence, uncomplicated: Secondary | ICD-10-CM | POA: Diagnosis not present

## 2017-11-26 DIAGNOSIS — R5383 Other fatigue: Secondary | ICD-10-CM | POA: Diagnosis not present

## 2017-11-26 DIAGNOSIS — N951 Menopausal and female climacteric states: Secondary | ICD-10-CM | POA: Diagnosis not present

## 2017-11-26 DIAGNOSIS — J310 Chronic rhinitis: Secondary | ICD-10-CM | POA: Diagnosis not present

## 2017-11-26 DIAGNOSIS — J4540 Moderate persistent asthma, uncomplicated: Secondary | ICD-10-CM | POA: Diagnosis not present

## 2017-11-26 DIAGNOSIS — Z1331 Encounter for screening for depression: Secondary | ICD-10-CM | POA: Diagnosis not present

## 2017-11-26 DIAGNOSIS — Z0000 Encounter for general adult medical examination without abnormal findings: Secondary | ICD-10-CM | POA: Diagnosis not present

## 2017-11-26 DIAGNOSIS — I10 Essential (primary) hypertension: Secondary | ICD-10-CM | POA: Diagnosis not present

## 2017-11-26 DIAGNOSIS — F17210 Nicotine dependence, cigarettes, uncomplicated: Secondary | ICD-10-CM | POA: Diagnosis not present

## 2017-11-26 DIAGNOSIS — Z6829 Body mass index (BMI) 29.0-29.9, adult: Secondary | ICD-10-CM | POA: Diagnosis not present

## 2017-11-26 DIAGNOSIS — E785 Hyperlipidemia, unspecified: Secondary | ICD-10-CM | POA: Diagnosis not present

## 2017-11-26 DIAGNOSIS — E119 Type 2 diabetes mellitus without complications: Secondary | ICD-10-CM | POA: Diagnosis not present

## 2017-11-26 DIAGNOSIS — J209 Acute bronchitis, unspecified: Secondary | ICD-10-CM | POA: Diagnosis not present

## 2017-11-26 DIAGNOSIS — Z760 Encounter for issue of repeat prescription: Secondary | ICD-10-CM | POA: Diagnosis not present

## 2017-12-06 DIAGNOSIS — J4540 Moderate persistent asthma, uncomplicated: Secondary | ICD-10-CM | POA: Diagnosis not present

## 2017-12-25 DIAGNOSIS — Z79891 Long term (current) use of opiate analgesic: Secondary | ICD-10-CM | POA: Diagnosis not present

## 2017-12-25 DIAGNOSIS — B1920 Unspecified viral hepatitis C without hepatic coma: Secondary | ICD-10-CM | POA: Diagnosis not present

## 2017-12-25 DIAGNOSIS — M1990 Unspecified osteoarthritis, unspecified site: Secondary | ICD-10-CM | POA: Diagnosis not present

## 2017-12-25 DIAGNOSIS — M47816 Spondylosis without myelopathy or radiculopathy, lumbar region: Secondary | ICD-10-CM | POA: Diagnosis not present

## 2017-12-25 DIAGNOSIS — J449 Chronic obstructive pulmonary disease, unspecified: Secondary | ICD-10-CM | POA: Diagnosis not present

## 2017-12-25 DIAGNOSIS — Z96652 Presence of left artificial knee joint: Secondary | ICD-10-CM | POA: Diagnosis not present

## 2017-12-25 DIAGNOSIS — F1120 Opioid dependence, uncomplicated: Secondary | ICD-10-CM | POA: Diagnosis not present

## 2017-12-25 DIAGNOSIS — Z96651 Presence of right artificial knee joint: Secondary | ICD-10-CM | POA: Diagnosis not present

## 2017-12-25 DIAGNOSIS — G894 Chronic pain syndrome: Secondary | ICD-10-CM | POA: Diagnosis not present

## 2018-01-06 DIAGNOSIS — J4540 Moderate persistent asthma, uncomplicated: Secondary | ICD-10-CM | POA: Diagnosis not present

## 2018-01-08 DIAGNOSIS — B1920 Unspecified viral hepatitis C without hepatic coma: Secondary | ICD-10-CM | POA: Diagnosis not present

## 2018-01-08 DIAGNOSIS — M1990 Unspecified osteoarthritis, unspecified site: Secondary | ICD-10-CM | POA: Diagnosis not present

## 2018-01-08 DIAGNOSIS — G894 Chronic pain syndrome: Secondary | ICD-10-CM | POA: Diagnosis not present

## 2018-01-08 DIAGNOSIS — J449 Chronic obstructive pulmonary disease, unspecified: Secondary | ICD-10-CM | POA: Diagnosis not present

## 2018-01-08 DIAGNOSIS — Z79891 Long term (current) use of opiate analgesic: Secondary | ICD-10-CM | POA: Diagnosis not present

## 2018-01-08 DIAGNOSIS — F1120 Opioid dependence, uncomplicated: Secondary | ICD-10-CM | POA: Diagnosis not present

## 2018-01-08 DIAGNOSIS — Z96651 Presence of right artificial knee joint: Secondary | ICD-10-CM | POA: Diagnosis not present

## 2018-01-08 DIAGNOSIS — M47816 Spondylosis without myelopathy or radiculopathy, lumbar region: Secondary | ICD-10-CM | POA: Diagnosis not present

## 2018-01-08 DIAGNOSIS — Z96652 Presence of left artificial knee joint: Secondary | ICD-10-CM | POA: Diagnosis not present

## 2018-01-21 DIAGNOSIS — B070 Plantar wart: Secondary | ICD-10-CM | POA: Diagnosis not present

## 2018-01-21 DIAGNOSIS — Z720 Tobacco use: Secondary | ICD-10-CM | POA: Diagnosis not present

## 2018-01-21 DIAGNOSIS — Z6829 Body mass index (BMI) 29.0-29.9, adult: Secondary | ICD-10-CM | POA: Diagnosis not present

## 2018-01-22 DIAGNOSIS — J449 Chronic obstructive pulmonary disease, unspecified: Secondary | ICD-10-CM | POA: Diagnosis not present

## 2018-01-22 DIAGNOSIS — M1990 Unspecified osteoarthritis, unspecified site: Secondary | ICD-10-CM | POA: Diagnosis not present

## 2018-01-22 DIAGNOSIS — Z96651 Presence of right artificial knee joint: Secondary | ICD-10-CM | POA: Diagnosis not present

## 2018-01-22 DIAGNOSIS — G894 Chronic pain syndrome: Secondary | ICD-10-CM | POA: Diagnosis not present

## 2018-01-22 DIAGNOSIS — F1120 Opioid dependence, uncomplicated: Secondary | ICD-10-CM | POA: Diagnosis not present

## 2018-01-22 DIAGNOSIS — M47816 Spondylosis without myelopathy or radiculopathy, lumbar region: Secondary | ICD-10-CM | POA: Diagnosis not present

## 2018-01-22 DIAGNOSIS — B1920 Unspecified viral hepatitis C without hepatic coma: Secondary | ICD-10-CM | POA: Diagnosis not present

## 2018-01-22 DIAGNOSIS — Z79891 Long term (current) use of opiate analgesic: Secondary | ICD-10-CM | POA: Diagnosis not present

## 2018-01-22 DIAGNOSIS — Z96652 Presence of left artificial knee joint: Secondary | ICD-10-CM | POA: Diagnosis not present

## 2018-01-27 DIAGNOSIS — G4730 Sleep apnea, unspecified: Secondary | ICD-10-CM | POA: Diagnosis not present

## 2018-01-27 DIAGNOSIS — R5383 Other fatigue: Secondary | ICD-10-CM | POA: Diagnosis not present

## 2018-01-27 DIAGNOSIS — J310 Chronic rhinitis: Secondary | ICD-10-CM | POA: Diagnosis not present

## 2018-01-27 DIAGNOSIS — J449 Chronic obstructive pulmonary disease, unspecified: Secondary | ICD-10-CM | POA: Diagnosis not present

## 2018-01-30 DIAGNOSIS — B070 Plantar wart: Secondary | ICD-10-CM | POA: Insufficient documentation

## 2018-01-30 DIAGNOSIS — J449 Chronic obstructive pulmonary disease, unspecified: Secondary | ICD-10-CM | POA: Diagnosis not present

## 2018-01-30 DIAGNOSIS — E119 Type 2 diabetes mellitus without complications: Secondary | ICD-10-CM

## 2018-01-30 HISTORY — DX: Type 2 diabetes mellitus without complications: E11.9

## 2018-01-30 HISTORY — DX: Plantar wart: B07.0

## 2018-02-05 DIAGNOSIS — J4540 Moderate persistent asthma, uncomplicated: Secondary | ICD-10-CM | POA: Diagnosis not present

## 2018-02-13 DIAGNOSIS — B070 Plantar wart: Secondary | ICD-10-CM | POA: Diagnosis not present

## 2018-02-13 DIAGNOSIS — E119 Type 2 diabetes mellitus without complications: Secondary | ICD-10-CM | POA: Diagnosis not present

## 2018-02-24 DIAGNOSIS — Z79891 Long term (current) use of opiate analgesic: Secondary | ICD-10-CM | POA: Diagnosis not present

## 2018-02-24 DIAGNOSIS — J449 Chronic obstructive pulmonary disease, unspecified: Secondary | ICD-10-CM | POA: Diagnosis not present

## 2018-02-24 DIAGNOSIS — M47816 Spondylosis without myelopathy or radiculopathy, lumbar region: Secondary | ICD-10-CM | POA: Diagnosis not present

## 2018-02-24 DIAGNOSIS — Z96651 Presence of right artificial knee joint: Secondary | ICD-10-CM | POA: Diagnosis not present

## 2018-02-24 DIAGNOSIS — M1990 Unspecified osteoarthritis, unspecified site: Secondary | ICD-10-CM | POA: Diagnosis not present

## 2018-02-24 DIAGNOSIS — B1920 Unspecified viral hepatitis C without hepatic coma: Secondary | ICD-10-CM | POA: Diagnosis not present

## 2018-02-24 DIAGNOSIS — Z96652 Presence of left artificial knee joint: Secondary | ICD-10-CM | POA: Diagnosis not present

## 2018-02-24 DIAGNOSIS — G894 Chronic pain syndrome: Secondary | ICD-10-CM | POA: Diagnosis not present

## 2018-02-24 DIAGNOSIS — F1120 Opioid dependence, uncomplicated: Secondary | ICD-10-CM | POA: Diagnosis not present

## 2018-03-02 DIAGNOSIS — J449 Chronic obstructive pulmonary disease, unspecified: Secondary | ICD-10-CM | POA: Diagnosis not present

## 2018-03-08 DIAGNOSIS — J4540 Moderate persistent asthma, uncomplicated: Secondary | ICD-10-CM | POA: Diagnosis not present

## 2018-03-18 DIAGNOSIS — E119 Type 2 diabetes mellitus without complications: Secondary | ICD-10-CM | POA: Diagnosis not present

## 2018-03-18 DIAGNOSIS — B070 Plantar wart: Secondary | ICD-10-CM | POA: Diagnosis not present

## 2018-03-21 DIAGNOSIS — Z79891 Long term (current) use of opiate analgesic: Secondary | ICD-10-CM | POA: Diagnosis not present

## 2018-03-21 DIAGNOSIS — B1920 Unspecified viral hepatitis C without hepatic coma: Secondary | ICD-10-CM | POA: Diagnosis not present

## 2018-03-21 DIAGNOSIS — G894 Chronic pain syndrome: Secondary | ICD-10-CM | POA: Diagnosis not present

## 2018-03-21 DIAGNOSIS — J449 Chronic obstructive pulmonary disease, unspecified: Secondary | ICD-10-CM | POA: Diagnosis not present

## 2018-03-21 DIAGNOSIS — M47816 Spondylosis without myelopathy or radiculopathy, lumbar region: Secondary | ICD-10-CM | POA: Diagnosis not present

## 2018-03-21 DIAGNOSIS — M1990 Unspecified osteoarthritis, unspecified site: Secondary | ICD-10-CM | POA: Diagnosis not present

## 2018-03-21 DIAGNOSIS — Z96652 Presence of left artificial knee joint: Secondary | ICD-10-CM | POA: Diagnosis not present

## 2018-03-21 DIAGNOSIS — F1120 Opioid dependence, uncomplicated: Secondary | ICD-10-CM | POA: Diagnosis not present

## 2018-03-21 DIAGNOSIS — Z96651 Presence of right artificial knee joint: Secondary | ICD-10-CM | POA: Diagnosis not present

## 2018-04-01 DIAGNOSIS — E559 Vitamin D deficiency, unspecified: Secondary | ICD-10-CM | POA: Diagnosis not present

## 2018-04-01 DIAGNOSIS — E785 Hyperlipidemia, unspecified: Secondary | ICD-10-CM | POA: Diagnosis not present

## 2018-04-01 DIAGNOSIS — Z79899 Other long term (current) drug therapy: Secondary | ICD-10-CM | POA: Diagnosis not present

## 2018-04-02 DIAGNOSIS — J449 Chronic obstructive pulmonary disease, unspecified: Secondary | ICD-10-CM | POA: Diagnosis not present

## 2018-04-08 DIAGNOSIS — J4540 Moderate persistent asthma, uncomplicated: Secondary | ICD-10-CM | POA: Diagnosis not present

## 2018-04-21 DIAGNOSIS — Z79899 Other long term (current) drug therapy: Secondary | ICD-10-CM | POA: Diagnosis not present

## 2018-04-21 DIAGNOSIS — I10 Essential (primary) hypertension: Secondary | ICD-10-CM | POA: Diagnosis not present

## 2018-04-21 DIAGNOSIS — E559 Vitamin D deficiency, unspecified: Secondary | ICD-10-CM | POA: Diagnosis not present

## 2018-04-21 DIAGNOSIS — M7710 Lateral epicondylitis, unspecified elbow: Secondary | ICD-10-CM | POA: Diagnosis not present

## 2018-04-21 DIAGNOSIS — E785 Hyperlipidemia, unspecified: Secondary | ICD-10-CM | POA: Diagnosis not present

## 2018-04-21 DIAGNOSIS — E119 Type 2 diabetes mellitus without complications: Secondary | ICD-10-CM | POA: Diagnosis not present

## 2018-04-21 DIAGNOSIS — M7700 Medial epicondylitis, unspecified elbow: Secondary | ICD-10-CM | POA: Diagnosis not present

## 2018-04-21 DIAGNOSIS — Z6830 Body mass index (BMI) 30.0-30.9, adult: Secondary | ICD-10-CM | POA: Diagnosis not present

## 2018-04-21 DIAGNOSIS — B029 Zoster without complications: Secondary | ICD-10-CM | POA: Diagnosis not present

## 2018-04-24 DIAGNOSIS — J449 Chronic obstructive pulmonary disease, unspecified: Secondary | ICD-10-CM | POA: Diagnosis not present

## 2018-04-24 DIAGNOSIS — Z96652 Presence of left artificial knee joint: Secondary | ICD-10-CM | POA: Diagnosis not present

## 2018-04-24 DIAGNOSIS — F1120 Opioid dependence, uncomplicated: Secondary | ICD-10-CM | POA: Diagnosis not present

## 2018-04-24 DIAGNOSIS — G894 Chronic pain syndrome: Secondary | ICD-10-CM | POA: Diagnosis not present

## 2018-04-24 DIAGNOSIS — M7700 Medial epicondylitis, unspecified elbow: Secondary | ICD-10-CM | POA: Diagnosis not present

## 2018-04-24 DIAGNOSIS — M47816 Spondylosis without myelopathy or radiculopathy, lumbar region: Secondary | ICD-10-CM | POA: Diagnosis not present

## 2018-04-24 DIAGNOSIS — Z79891 Long term (current) use of opiate analgesic: Secondary | ICD-10-CM | POA: Diagnosis not present

## 2018-04-24 DIAGNOSIS — M1990 Unspecified osteoarthritis, unspecified site: Secondary | ICD-10-CM | POA: Diagnosis not present

## 2018-04-24 DIAGNOSIS — Z96651 Presence of right artificial knee joint: Secondary | ICD-10-CM | POA: Diagnosis not present

## 2018-04-29 DIAGNOSIS — Z23 Encounter for immunization: Secondary | ICD-10-CM | POA: Diagnosis not present

## 2018-04-29 DIAGNOSIS — F17210 Nicotine dependence, cigarettes, uncomplicated: Secondary | ICD-10-CM | POA: Diagnosis not present

## 2018-04-29 DIAGNOSIS — R5383 Other fatigue: Secondary | ICD-10-CM | POA: Diagnosis not present

## 2018-04-29 DIAGNOSIS — J310 Chronic rhinitis: Secondary | ICD-10-CM | POA: Diagnosis not present

## 2018-04-29 DIAGNOSIS — J441 Chronic obstructive pulmonary disease with (acute) exacerbation: Secondary | ICD-10-CM | POA: Diagnosis not present

## 2018-04-29 DIAGNOSIS — G4730 Sleep apnea, unspecified: Secondary | ICD-10-CM | POA: Diagnosis not present

## 2018-05-02 DIAGNOSIS — F17210 Nicotine dependence, cigarettes, uncomplicated: Secondary | ICD-10-CM | POA: Diagnosis not present

## 2018-05-02 DIAGNOSIS — J310 Chronic rhinitis: Secondary | ICD-10-CM | POA: Diagnosis not present

## 2018-05-02 DIAGNOSIS — J301 Allergic rhinitis due to pollen: Secondary | ICD-10-CM | POA: Diagnosis not present

## 2018-05-02 DIAGNOSIS — G4730 Sleep apnea, unspecified: Secondary | ICD-10-CM | POA: Diagnosis not present

## 2018-05-02 DIAGNOSIS — J441 Chronic obstructive pulmonary disease with (acute) exacerbation: Secondary | ICD-10-CM | POA: Diagnosis not present

## 2018-05-02 DIAGNOSIS — J449 Chronic obstructive pulmonary disease, unspecified: Secondary | ICD-10-CM | POA: Diagnosis not present

## 2018-05-08 DIAGNOSIS — J4540 Moderate persistent asthma, uncomplicated: Secondary | ICD-10-CM | POA: Diagnosis not present

## 2018-05-26 DIAGNOSIS — M1990 Unspecified osteoarthritis, unspecified site: Secondary | ICD-10-CM | POA: Diagnosis not present

## 2018-05-26 DIAGNOSIS — M7700 Medial epicondylitis, unspecified elbow: Secondary | ICD-10-CM | POA: Diagnosis not present

## 2018-05-26 DIAGNOSIS — M47816 Spondylosis without myelopathy or radiculopathy, lumbar region: Secondary | ICD-10-CM | POA: Diagnosis not present

## 2018-05-26 DIAGNOSIS — G894 Chronic pain syndrome: Secondary | ICD-10-CM | POA: Diagnosis not present

## 2018-05-26 DIAGNOSIS — Z79891 Long term (current) use of opiate analgesic: Secondary | ICD-10-CM | POA: Diagnosis not present

## 2018-05-26 DIAGNOSIS — F1120 Opioid dependence, uncomplicated: Secondary | ICD-10-CM | POA: Diagnosis not present

## 2018-05-26 DIAGNOSIS — Z96651 Presence of right artificial knee joint: Secondary | ICD-10-CM | POA: Diagnosis not present

## 2018-05-26 DIAGNOSIS — J449 Chronic obstructive pulmonary disease, unspecified: Secondary | ICD-10-CM | POA: Diagnosis not present

## 2018-05-26 DIAGNOSIS — Z96652 Presence of left artificial knee joint: Secondary | ICD-10-CM | POA: Diagnosis not present

## 2018-05-28 DIAGNOSIS — F1120 Opioid dependence, uncomplicated: Secondary | ICD-10-CM | POA: Diagnosis not present

## 2018-05-28 DIAGNOSIS — G894 Chronic pain syndrome: Secondary | ICD-10-CM | POA: Diagnosis not present

## 2018-05-28 DIAGNOSIS — M5136 Other intervertebral disc degeneration, lumbar region: Secondary | ICD-10-CM | POA: Diagnosis not present

## 2018-05-28 DIAGNOSIS — M47816 Spondylosis without myelopathy or radiculopathy, lumbar region: Secondary | ICD-10-CM | POA: Diagnosis not present

## 2018-05-28 DIAGNOSIS — Z96651 Presence of right artificial knee joint: Secondary | ICD-10-CM | POA: Diagnosis not present

## 2018-05-28 DIAGNOSIS — Z79891 Long term (current) use of opiate analgesic: Secondary | ICD-10-CM | POA: Diagnosis not present

## 2018-05-28 DIAGNOSIS — J449 Chronic obstructive pulmonary disease, unspecified: Secondary | ICD-10-CM | POA: Diagnosis not present

## 2018-05-28 DIAGNOSIS — M1990 Unspecified osteoarthritis, unspecified site: Secondary | ICD-10-CM | POA: Diagnosis not present

## 2018-05-28 DIAGNOSIS — Z96652 Presence of left artificial knee joint: Secondary | ICD-10-CM | POA: Diagnosis not present

## 2018-05-28 DIAGNOSIS — M7700 Medial epicondylitis, unspecified elbow: Secondary | ICD-10-CM | POA: Diagnosis not present

## 2018-05-28 DIAGNOSIS — M1711 Unilateral primary osteoarthritis, right knee: Secondary | ICD-10-CM | POA: Diagnosis not present

## 2018-06-08 DIAGNOSIS — J4540 Moderate persistent asthma, uncomplicated: Secondary | ICD-10-CM | POA: Diagnosis not present

## 2018-06-10 DIAGNOSIS — D500 Iron deficiency anemia secondary to blood loss (chronic): Secondary | ICD-10-CM | POA: Diagnosis not present

## 2018-06-12 DIAGNOSIS — Z1212 Encounter for screening for malignant neoplasm of rectum: Secondary | ICD-10-CM | POA: Diagnosis not present

## 2018-06-12 DIAGNOSIS — K589 Irritable bowel syndrome without diarrhea: Secondary | ICD-10-CM | POA: Diagnosis not present

## 2018-06-12 DIAGNOSIS — D500 Iron deficiency anemia secondary to blood loss (chronic): Secondary | ICD-10-CM | POA: Diagnosis not present

## 2018-06-24 DIAGNOSIS — M7700 Medial epicondylitis, unspecified elbow: Secondary | ICD-10-CM | POA: Diagnosis not present

## 2018-06-24 DIAGNOSIS — F1120 Opioid dependence, uncomplicated: Secondary | ICD-10-CM | POA: Diagnosis not present

## 2018-06-24 DIAGNOSIS — G4730 Sleep apnea, unspecified: Secondary | ICD-10-CM | POA: Diagnosis not present

## 2018-06-24 DIAGNOSIS — Z79891 Long term (current) use of opiate analgesic: Secondary | ICD-10-CM | POA: Diagnosis not present

## 2018-06-24 DIAGNOSIS — J449 Chronic obstructive pulmonary disease, unspecified: Secondary | ICD-10-CM | POA: Diagnosis not present

## 2018-06-24 DIAGNOSIS — F17210 Nicotine dependence, cigarettes, uncomplicated: Secondary | ICD-10-CM | POA: Diagnosis not present

## 2018-06-24 DIAGNOSIS — J4540 Moderate persistent asthma, uncomplicated: Secondary | ICD-10-CM | POA: Diagnosis not present

## 2018-06-24 DIAGNOSIS — Z96652 Presence of left artificial knee joint: Secondary | ICD-10-CM | POA: Diagnosis not present

## 2018-06-24 DIAGNOSIS — Z96651 Presence of right artificial knee joint: Secondary | ICD-10-CM | POA: Diagnosis not present

## 2018-06-24 DIAGNOSIS — J310 Chronic rhinitis: Secondary | ICD-10-CM | POA: Diagnosis not present

## 2018-06-24 DIAGNOSIS — G894 Chronic pain syndrome: Secondary | ICD-10-CM | POA: Diagnosis not present

## 2018-06-24 DIAGNOSIS — J301 Allergic rhinitis due to pollen: Secondary | ICD-10-CM | POA: Diagnosis not present

## 2018-06-24 DIAGNOSIS — M47816 Spondylosis without myelopathy or radiculopathy, lumbar region: Secondary | ICD-10-CM | POA: Diagnosis not present

## 2018-06-24 DIAGNOSIS — M1990 Unspecified osteoarthritis, unspecified site: Secondary | ICD-10-CM | POA: Diagnosis not present

## 2018-06-25 DIAGNOSIS — M1990 Unspecified osteoarthritis, unspecified site: Secondary | ICD-10-CM | POA: Diagnosis not present

## 2018-06-25 DIAGNOSIS — Z96652 Presence of left artificial knee joint: Secondary | ICD-10-CM | POA: Diagnosis not present

## 2018-06-25 DIAGNOSIS — J449 Chronic obstructive pulmonary disease, unspecified: Secondary | ICD-10-CM | POA: Diagnosis not present

## 2018-06-25 DIAGNOSIS — M7700 Medial epicondylitis, unspecified elbow: Secondary | ICD-10-CM | POA: Diagnosis not present

## 2018-06-25 DIAGNOSIS — F1120 Opioid dependence, uncomplicated: Secondary | ICD-10-CM | POA: Diagnosis not present

## 2018-06-25 DIAGNOSIS — Z79891 Long term (current) use of opiate analgesic: Secondary | ICD-10-CM | POA: Diagnosis not present

## 2018-06-25 DIAGNOSIS — G894 Chronic pain syndrome: Secondary | ICD-10-CM | POA: Diagnosis not present

## 2018-06-25 DIAGNOSIS — M47816 Spondylosis without myelopathy or radiculopathy, lumbar region: Secondary | ICD-10-CM | POA: Diagnosis not present

## 2018-06-25 DIAGNOSIS — Z96651 Presence of right artificial knee joint: Secondary | ICD-10-CM | POA: Diagnosis not present

## 2018-07-03 DIAGNOSIS — E119 Type 2 diabetes mellitus without complications: Secondary | ICD-10-CM | POA: Diagnosis not present

## 2018-07-03 DIAGNOSIS — E785 Hyperlipidemia, unspecified: Secondary | ICD-10-CM | POA: Diagnosis not present

## 2018-07-03 DIAGNOSIS — Z79899 Other long term (current) drug therapy: Secondary | ICD-10-CM | POA: Diagnosis not present

## 2018-07-08 DIAGNOSIS — J4540 Moderate persistent asthma, uncomplicated: Secondary | ICD-10-CM | POA: Diagnosis not present

## 2018-07-22 DIAGNOSIS — E119 Type 2 diabetes mellitus without complications: Secondary | ICD-10-CM | POA: Diagnosis not present

## 2018-07-22 DIAGNOSIS — E782 Mixed hyperlipidemia: Secondary | ICD-10-CM | POA: Diagnosis not present

## 2018-07-28 DIAGNOSIS — M7700 Medial epicondylitis, unspecified elbow: Secondary | ICD-10-CM | POA: Diagnosis not present

## 2018-07-28 DIAGNOSIS — Z79891 Long term (current) use of opiate analgesic: Secondary | ICD-10-CM | POA: Diagnosis not present

## 2018-07-28 DIAGNOSIS — M179 Osteoarthritis of knee, unspecified: Secondary | ICD-10-CM | POA: Diagnosis not present

## 2018-07-28 DIAGNOSIS — Z720 Tobacco use: Secondary | ICD-10-CM | POA: Diagnosis not present

## 2018-07-28 DIAGNOSIS — M47816 Spondylosis without myelopathy or radiculopathy, lumbar region: Secondary | ICD-10-CM | POA: Diagnosis not present

## 2018-07-28 DIAGNOSIS — Z96652 Presence of left artificial knee joint: Secondary | ICD-10-CM | POA: Diagnosis not present

## 2018-07-28 DIAGNOSIS — Z96651 Presence of right artificial knee joint: Secondary | ICD-10-CM | POA: Diagnosis not present

## 2018-07-28 DIAGNOSIS — F1120 Opioid dependence, uncomplicated: Secondary | ICD-10-CM | POA: Diagnosis not present

## 2018-07-28 DIAGNOSIS — J449 Chronic obstructive pulmonary disease, unspecified: Secondary | ICD-10-CM | POA: Diagnosis not present

## 2018-07-28 DIAGNOSIS — G894 Chronic pain syndrome: Secondary | ICD-10-CM | POA: Diagnosis not present

## 2018-07-28 DIAGNOSIS — M1990 Unspecified osteoarthritis, unspecified site: Secondary | ICD-10-CM | POA: Diagnosis not present

## 2018-07-28 DIAGNOSIS — M5136 Other intervertebral disc degeneration, lumbar region: Secondary | ICD-10-CM | POA: Diagnosis not present

## 2018-08-26 DIAGNOSIS — Z96652 Presence of left artificial knee joint: Secondary | ICD-10-CM | POA: Diagnosis not present

## 2018-08-26 DIAGNOSIS — F1120 Opioid dependence, uncomplicated: Secondary | ICD-10-CM | POA: Diagnosis not present

## 2018-08-26 DIAGNOSIS — M7700 Medial epicondylitis, unspecified elbow: Secondary | ICD-10-CM | POA: Diagnosis not present

## 2018-08-26 DIAGNOSIS — Z96651 Presence of right artificial knee joint: Secondary | ICD-10-CM | POA: Diagnosis not present

## 2018-08-26 DIAGNOSIS — M1990 Unspecified osteoarthritis, unspecified site: Secondary | ICD-10-CM | POA: Diagnosis not present

## 2018-08-26 DIAGNOSIS — M47816 Spondylosis without myelopathy or radiculopathy, lumbar region: Secondary | ICD-10-CM | POA: Diagnosis not present

## 2018-08-26 DIAGNOSIS — Z79891 Long term (current) use of opiate analgesic: Secondary | ICD-10-CM | POA: Diagnosis not present

## 2018-08-26 DIAGNOSIS — J449 Chronic obstructive pulmonary disease, unspecified: Secondary | ICD-10-CM | POA: Diagnosis not present

## 2018-08-26 DIAGNOSIS — G894 Chronic pain syndrome: Secondary | ICD-10-CM | POA: Diagnosis not present

## 2018-08-27 DIAGNOSIS — H5200 Hypermetropia, unspecified eye: Secondary | ICD-10-CM | POA: Diagnosis not present

## 2018-09-05 DIAGNOSIS — M25552 Pain in left hip: Secondary | ICD-10-CM | POA: Diagnosis not present

## 2018-09-05 DIAGNOSIS — Z6830 Body mass index (BMI) 30.0-30.9, adult: Secondary | ICD-10-CM | POA: Diagnosis not present

## 2018-09-08 DIAGNOSIS — J4540 Moderate persistent asthma, uncomplicated: Secondary | ICD-10-CM | POA: Diagnosis not present

## 2018-09-10 DIAGNOSIS — M545 Low back pain: Secondary | ICD-10-CM | POA: Diagnosis not present

## 2018-09-23 DIAGNOSIS — H26492 Other secondary cataract, left eye: Secondary | ICD-10-CM | POA: Diagnosis not present

## 2018-09-24 DIAGNOSIS — G894 Chronic pain syndrome: Secondary | ICD-10-CM | POA: Diagnosis not present

## 2018-09-24 DIAGNOSIS — M7700 Medial epicondylitis, unspecified elbow: Secondary | ICD-10-CM | POA: Diagnosis not present

## 2018-09-24 DIAGNOSIS — Z96652 Presence of left artificial knee joint: Secondary | ICD-10-CM | POA: Diagnosis not present

## 2018-09-24 DIAGNOSIS — Z79891 Long term (current) use of opiate analgesic: Secondary | ICD-10-CM | POA: Diagnosis not present

## 2018-09-24 DIAGNOSIS — M47816 Spondylosis without myelopathy or radiculopathy, lumbar region: Secondary | ICD-10-CM | POA: Diagnosis not present

## 2018-09-24 DIAGNOSIS — M1990 Unspecified osteoarthritis, unspecified site: Secondary | ICD-10-CM | POA: Diagnosis not present

## 2018-09-24 DIAGNOSIS — M179 Osteoarthritis of knee, unspecified: Secondary | ICD-10-CM | POA: Diagnosis not present

## 2018-09-24 DIAGNOSIS — M5136 Other intervertebral disc degeneration, lumbar region: Secondary | ICD-10-CM | POA: Diagnosis not present

## 2018-09-24 DIAGNOSIS — Z96651 Presence of right artificial knee joint: Secondary | ICD-10-CM | POA: Diagnosis not present

## 2018-09-24 DIAGNOSIS — M545 Low back pain: Secondary | ICD-10-CM | POA: Diagnosis not present

## 2018-10-07 DIAGNOSIS — J4540 Moderate persistent asthma, uncomplicated: Secondary | ICD-10-CM | POA: Diagnosis not present

## 2018-10-21 DIAGNOSIS — I10 Essential (primary) hypertension: Secondary | ICD-10-CM | POA: Diagnosis not present

## 2018-10-21 DIAGNOSIS — E119 Type 2 diabetes mellitus without complications: Secondary | ICD-10-CM | POA: Diagnosis not present

## 2018-10-24 DIAGNOSIS — M1990 Unspecified osteoarthritis, unspecified site: Secondary | ICD-10-CM | POA: Diagnosis not present

## 2018-10-24 DIAGNOSIS — G894 Chronic pain syndrome: Secondary | ICD-10-CM | POA: Diagnosis not present

## 2018-10-24 DIAGNOSIS — M5136 Other intervertebral disc degeneration, lumbar region: Secondary | ICD-10-CM | POA: Diagnosis not present

## 2018-10-24 DIAGNOSIS — Z79891 Long term (current) use of opiate analgesic: Secondary | ICD-10-CM | POA: Diagnosis not present

## 2018-10-24 DIAGNOSIS — Z96651 Presence of right artificial knee joint: Secondary | ICD-10-CM | POA: Diagnosis not present

## 2018-10-24 DIAGNOSIS — Z96652 Presence of left artificial knee joint: Secondary | ICD-10-CM | POA: Diagnosis not present

## 2018-10-24 DIAGNOSIS — M47816 Spondylosis without myelopathy or radiculopathy, lumbar region: Secondary | ICD-10-CM | POA: Diagnosis not present

## 2018-10-24 DIAGNOSIS — M179 Osteoarthritis of knee, unspecified: Secondary | ICD-10-CM | POA: Diagnosis not present

## 2018-10-24 DIAGNOSIS — M7700 Medial epicondylitis, unspecified elbow: Secondary | ICD-10-CM | POA: Diagnosis not present

## 2018-11-05 DIAGNOSIS — G894 Chronic pain syndrome: Secondary | ICD-10-CM | POA: Diagnosis not present

## 2018-11-05 DIAGNOSIS — M5136 Other intervertebral disc degeneration, lumbar region: Secondary | ICD-10-CM | POA: Diagnosis not present

## 2018-11-05 DIAGNOSIS — M47816 Spondylosis without myelopathy or radiculopathy, lumbar region: Secondary | ICD-10-CM | POA: Diagnosis not present

## 2018-11-05 DIAGNOSIS — Z96651 Presence of right artificial knee joint: Secondary | ICD-10-CM | POA: Diagnosis not present

## 2018-11-05 DIAGNOSIS — Z79891 Long term (current) use of opiate analgesic: Secondary | ICD-10-CM | POA: Diagnosis not present

## 2018-11-05 DIAGNOSIS — M179 Osteoarthritis of knee, unspecified: Secondary | ICD-10-CM | POA: Diagnosis not present

## 2018-11-05 DIAGNOSIS — M7700 Medial epicondylitis, unspecified elbow: Secondary | ICD-10-CM | POA: Diagnosis not present

## 2018-11-05 DIAGNOSIS — M1990 Unspecified osteoarthritis, unspecified site: Secondary | ICD-10-CM | POA: Diagnosis not present

## 2018-11-05 DIAGNOSIS — Z96652 Presence of left artificial knee joint: Secondary | ICD-10-CM | POA: Diagnosis not present

## 2018-11-07 DIAGNOSIS — J4540 Moderate persistent asthma, uncomplicated: Secondary | ICD-10-CM | POA: Diagnosis not present

## 2018-11-20 DIAGNOSIS — E119 Type 2 diabetes mellitus without complications: Secondary | ICD-10-CM | POA: Diagnosis not present

## 2018-11-20 DIAGNOSIS — I10 Essential (primary) hypertension: Secondary | ICD-10-CM | POA: Diagnosis not present

## 2018-11-24 DIAGNOSIS — G894 Chronic pain syndrome: Secondary | ICD-10-CM | POA: Diagnosis not present

## 2018-11-24 DIAGNOSIS — Z96651 Presence of right artificial knee joint: Secondary | ICD-10-CM | POA: Diagnosis not present

## 2018-11-24 DIAGNOSIS — M47816 Spondylosis without myelopathy or radiculopathy, lumbar region: Secondary | ICD-10-CM | POA: Diagnosis not present

## 2018-11-24 DIAGNOSIS — M5136 Other intervertebral disc degeneration, lumbar region: Secondary | ICD-10-CM | POA: Diagnosis not present

## 2018-11-24 DIAGNOSIS — Z96652 Presence of left artificial knee joint: Secondary | ICD-10-CM | POA: Diagnosis not present

## 2018-11-24 DIAGNOSIS — M7700 Medial epicondylitis, unspecified elbow: Secondary | ICD-10-CM | POA: Diagnosis not present

## 2018-11-24 DIAGNOSIS — Z79891 Long term (current) use of opiate analgesic: Secondary | ICD-10-CM | POA: Diagnosis not present

## 2018-11-24 DIAGNOSIS — M179 Osteoarthritis of knee, unspecified: Secondary | ICD-10-CM | POA: Diagnosis not present

## 2018-11-24 DIAGNOSIS — M1990 Unspecified osteoarthritis, unspecified site: Secondary | ICD-10-CM | POA: Diagnosis not present

## 2018-11-27 DIAGNOSIS — Z6830 Body mass index (BMI) 30.0-30.9, adult: Secondary | ICD-10-CM | POA: Diagnosis not present

## 2018-11-27 DIAGNOSIS — E119 Type 2 diabetes mellitus without complications: Secondary | ICD-10-CM | POA: Diagnosis not present

## 2018-11-27 DIAGNOSIS — F410 Panic disorder [episodic paroxysmal anxiety] without agoraphobia: Secondary | ICD-10-CM | POA: Diagnosis not present

## 2018-12-07 DIAGNOSIS — J4540 Moderate persistent asthma, uncomplicated: Secondary | ICD-10-CM | POA: Diagnosis not present

## 2018-12-10 DIAGNOSIS — E119 Type 2 diabetes mellitus without complications: Secondary | ICD-10-CM | POA: Diagnosis not present

## 2018-12-23 DIAGNOSIS — F17210 Nicotine dependence, cigarettes, uncomplicated: Secondary | ICD-10-CM | POA: Diagnosis not present

## 2018-12-23 DIAGNOSIS — J301 Allergic rhinitis due to pollen: Secondary | ICD-10-CM | POA: Diagnosis not present

## 2018-12-23 DIAGNOSIS — J310 Chronic rhinitis: Secondary | ICD-10-CM | POA: Diagnosis not present

## 2018-12-23 DIAGNOSIS — J4540 Moderate persistent asthma, uncomplicated: Secondary | ICD-10-CM | POA: Diagnosis not present

## 2018-12-23 DIAGNOSIS — G4730 Sleep apnea, unspecified: Secondary | ICD-10-CM | POA: Diagnosis not present

## 2018-12-25 DIAGNOSIS — M5136 Other intervertebral disc degeneration, lumbar region: Secondary | ICD-10-CM | POA: Diagnosis not present

## 2018-12-25 DIAGNOSIS — Z96651 Presence of right artificial knee joint: Secondary | ICD-10-CM | POA: Diagnosis not present

## 2018-12-25 DIAGNOSIS — M1990 Unspecified osteoarthritis, unspecified site: Secondary | ICD-10-CM | POA: Diagnosis not present

## 2018-12-25 DIAGNOSIS — Z96652 Presence of left artificial knee joint: Secondary | ICD-10-CM | POA: Diagnosis not present

## 2018-12-25 DIAGNOSIS — G894 Chronic pain syndrome: Secondary | ICD-10-CM | POA: Diagnosis not present

## 2018-12-25 DIAGNOSIS — Z79891 Long term (current) use of opiate analgesic: Secondary | ICD-10-CM | POA: Diagnosis not present

## 2018-12-25 DIAGNOSIS — M47816 Spondylosis without myelopathy or radiculopathy, lumbar region: Secondary | ICD-10-CM | POA: Diagnosis not present

## 2018-12-25 DIAGNOSIS — M179 Osteoarthritis of knee, unspecified: Secondary | ICD-10-CM | POA: Diagnosis not present

## 2018-12-25 DIAGNOSIS — M7700 Medial epicondylitis, unspecified elbow: Secondary | ICD-10-CM | POA: Diagnosis not present

## 2019-01-02 DIAGNOSIS — D500 Iron deficiency anemia secondary to blood loss (chronic): Secondary | ICD-10-CM | POA: Diagnosis not present

## 2019-01-06 DIAGNOSIS — D500 Iron deficiency anemia secondary to blood loss (chronic): Secondary | ICD-10-CM | POA: Diagnosis not present

## 2019-01-07 DIAGNOSIS — H02831 Dermatochalasis of right upper eyelid: Secondary | ICD-10-CM | POA: Diagnosis not present

## 2019-01-07 DIAGNOSIS — H02834 Dermatochalasis of left upper eyelid: Secondary | ICD-10-CM | POA: Diagnosis not present

## 2019-01-07 DIAGNOSIS — J4540 Moderate persistent asthma, uncomplicated: Secondary | ICD-10-CM | POA: Diagnosis not present

## 2019-01-22 DIAGNOSIS — Z96652 Presence of left artificial knee joint: Secondary | ICD-10-CM | POA: Diagnosis not present

## 2019-01-22 DIAGNOSIS — M47816 Spondylosis without myelopathy or radiculopathy, lumbar region: Secondary | ICD-10-CM | POA: Diagnosis not present

## 2019-01-22 DIAGNOSIS — M5136 Other intervertebral disc degeneration, lumbar region: Secondary | ICD-10-CM | POA: Diagnosis not present

## 2019-01-22 DIAGNOSIS — M1990 Unspecified osteoarthritis, unspecified site: Secondary | ICD-10-CM | POA: Diagnosis not present

## 2019-01-22 DIAGNOSIS — Z79891 Long term (current) use of opiate analgesic: Secondary | ICD-10-CM | POA: Diagnosis not present

## 2019-01-22 DIAGNOSIS — Z1389 Encounter for screening for other disorder: Secondary | ICD-10-CM | POA: Diagnosis not present

## 2019-01-22 DIAGNOSIS — G894 Chronic pain syndrome: Secondary | ICD-10-CM | POA: Diagnosis not present

## 2019-01-22 DIAGNOSIS — M7700 Medial epicondylitis, unspecified elbow: Secondary | ICD-10-CM | POA: Diagnosis not present

## 2019-01-22 DIAGNOSIS — Z96651 Presence of right artificial knee joint: Secondary | ICD-10-CM | POA: Diagnosis not present

## 2019-01-23 DIAGNOSIS — M545 Low back pain: Secondary | ICD-10-CM | POA: Diagnosis not present

## 2019-01-28 DIAGNOSIS — H0289 Other specified disorders of eyelid: Secondary | ICD-10-CM | POA: Diagnosis not present

## 2019-01-28 DIAGNOSIS — H02831 Dermatochalasis of right upper eyelid: Secondary | ICD-10-CM | POA: Diagnosis not present

## 2019-01-28 DIAGNOSIS — H02834 Dermatochalasis of left upper eyelid: Secondary | ICD-10-CM | POA: Diagnosis not present

## 2019-02-06 DIAGNOSIS — J4540 Moderate persistent asthma, uncomplicated: Secondary | ICD-10-CM | POA: Diagnosis not present

## 2019-02-19 DIAGNOSIS — Z79891 Long term (current) use of opiate analgesic: Secondary | ICD-10-CM | POA: Diagnosis not present

## 2019-02-19 DIAGNOSIS — M179 Osteoarthritis of knee, unspecified: Secondary | ICD-10-CM | POA: Diagnosis not present

## 2019-02-19 DIAGNOSIS — M5136 Other intervertebral disc degeneration, lumbar region: Secondary | ICD-10-CM | POA: Diagnosis not present

## 2019-02-19 DIAGNOSIS — Z1389 Encounter for screening for other disorder: Secondary | ICD-10-CM | POA: Diagnosis not present

## 2019-02-19 DIAGNOSIS — M47816 Spondylosis without myelopathy or radiculopathy, lumbar region: Secondary | ICD-10-CM | POA: Diagnosis not present

## 2019-02-19 DIAGNOSIS — G894 Chronic pain syndrome: Secondary | ICD-10-CM | POA: Diagnosis not present

## 2019-02-20 DIAGNOSIS — E785 Hyperlipidemia, unspecified: Secondary | ICD-10-CM | POA: Diagnosis not present

## 2019-02-20 DIAGNOSIS — I10 Essential (primary) hypertension: Secondary | ICD-10-CM | POA: Diagnosis not present

## 2019-02-20 DIAGNOSIS — J449 Chronic obstructive pulmonary disease, unspecified: Secondary | ICD-10-CM | POA: Diagnosis not present

## 2019-03-05 DIAGNOSIS — K589 Irritable bowel syndrome without diarrhea: Secondary | ICD-10-CM | POA: Diagnosis not present

## 2019-03-05 DIAGNOSIS — K219 Gastro-esophageal reflux disease without esophagitis: Secondary | ICD-10-CM | POA: Diagnosis not present

## 2019-03-05 DIAGNOSIS — D500 Iron deficiency anemia secondary to blood loss (chronic): Secondary | ICD-10-CM | POA: Diagnosis not present

## 2019-03-19 DIAGNOSIS — J449 Chronic obstructive pulmonary disease, unspecified: Secondary | ICD-10-CM | POA: Diagnosis not present

## 2019-03-19 DIAGNOSIS — Z0000 Encounter for general adult medical examination without abnormal findings: Secondary | ICD-10-CM | POA: Diagnosis not present

## 2019-03-19 DIAGNOSIS — M47816 Spondylosis without myelopathy or radiculopathy, lumbar region: Secondary | ICD-10-CM | POA: Diagnosis not present

## 2019-03-19 DIAGNOSIS — Z96651 Presence of right artificial knee joint: Secondary | ICD-10-CM | POA: Diagnosis not present

## 2019-03-19 DIAGNOSIS — Z6829 Body mass index (BMI) 29.0-29.9, adult: Secondary | ICD-10-CM | POA: Diagnosis not present

## 2019-03-19 DIAGNOSIS — G894 Chronic pain syndrome: Secondary | ICD-10-CM | POA: Diagnosis not present

## 2019-03-19 DIAGNOSIS — Z23 Encounter for immunization: Secondary | ICD-10-CM | POA: Diagnosis not present

## 2019-03-19 DIAGNOSIS — Z9181 History of falling: Secondary | ICD-10-CM | POA: Diagnosis not present

## 2019-03-19 DIAGNOSIS — M5136 Other intervertebral disc degeneration, lumbar region: Secondary | ICD-10-CM | POA: Diagnosis not present

## 2019-03-19 DIAGNOSIS — L03311 Cellulitis of abdominal wall: Secondary | ICD-10-CM | POA: Diagnosis not present

## 2019-03-19 DIAGNOSIS — Z79891 Long term (current) use of opiate analgesic: Secondary | ICD-10-CM | POA: Diagnosis not present

## 2019-03-19 DIAGNOSIS — Z1331 Encounter for screening for depression: Secondary | ICD-10-CM | POA: Diagnosis not present

## 2019-03-19 DIAGNOSIS — Z1389 Encounter for screening for other disorder: Secondary | ICD-10-CM | POA: Diagnosis not present

## 2019-03-19 DIAGNOSIS — Z96652 Presence of left artificial knee joint: Secondary | ICD-10-CM | POA: Diagnosis not present

## 2019-03-23 DIAGNOSIS — E785 Hyperlipidemia, unspecified: Secondary | ICD-10-CM | POA: Diagnosis not present

## 2019-03-23 DIAGNOSIS — E119 Type 2 diabetes mellitus without complications: Secondary | ICD-10-CM | POA: Diagnosis not present

## 2019-03-23 DIAGNOSIS — I10 Essential (primary) hypertension: Secondary | ICD-10-CM | POA: Diagnosis not present

## 2019-04-07 DIAGNOSIS — M4316 Spondylolisthesis, lumbar region: Secondary | ICD-10-CM | POA: Diagnosis not present

## 2019-04-07 DIAGNOSIS — M549 Dorsalgia, unspecified: Secondary | ICD-10-CM | POA: Diagnosis not present

## 2019-04-07 DIAGNOSIS — M5416 Radiculopathy, lumbar region: Secondary | ICD-10-CM | POA: Diagnosis not present

## 2019-04-16 DIAGNOSIS — Z96651 Presence of right artificial knee joint: Secondary | ICD-10-CM | POA: Diagnosis not present

## 2019-04-16 DIAGNOSIS — M47816 Spondylosis without myelopathy or radiculopathy, lumbar region: Secondary | ICD-10-CM | POA: Diagnosis not present

## 2019-04-16 DIAGNOSIS — M4727 Other spondylosis with radiculopathy, lumbosacral region: Secondary | ICD-10-CM | POA: Diagnosis not present

## 2019-04-16 DIAGNOSIS — M48061 Spinal stenosis, lumbar region without neurogenic claudication: Secondary | ICD-10-CM | POA: Diagnosis not present

## 2019-04-16 DIAGNOSIS — Z79891 Long term (current) use of opiate analgesic: Secondary | ICD-10-CM | POA: Diagnosis not present

## 2019-04-16 DIAGNOSIS — Z1389 Encounter for screening for other disorder: Secondary | ICD-10-CM | POA: Diagnosis not present

## 2019-04-16 DIAGNOSIS — Z96652 Presence of left artificial knee joint: Secondary | ICD-10-CM | POA: Diagnosis not present

## 2019-04-16 DIAGNOSIS — M5416 Radiculopathy, lumbar region: Secondary | ICD-10-CM | POA: Diagnosis not present

## 2019-04-16 DIAGNOSIS — M4316 Spondylolisthesis, lumbar region: Secondary | ICD-10-CM | POA: Diagnosis not present

## 2019-04-16 DIAGNOSIS — M5136 Other intervertebral disc degeneration, lumbar region: Secondary | ICD-10-CM | POA: Diagnosis not present

## 2019-04-16 DIAGNOSIS — M4726 Other spondylosis with radiculopathy, lumbar region: Secondary | ICD-10-CM | POA: Diagnosis not present

## 2019-04-16 DIAGNOSIS — G894 Chronic pain syndrome: Secondary | ICD-10-CM | POA: Diagnosis not present

## 2019-04-21 DIAGNOSIS — M4316 Spondylolisthesis, lumbar region: Secondary | ICD-10-CM | POA: Diagnosis not present

## 2019-04-21 DIAGNOSIS — M5416 Radiculopathy, lumbar region: Secondary | ICD-10-CM | POA: Diagnosis not present

## 2019-04-22 DIAGNOSIS — E119 Type 2 diabetes mellitus without complications: Secondary | ICD-10-CM | POA: Diagnosis not present

## 2019-04-22 DIAGNOSIS — I10 Essential (primary) hypertension: Secondary | ICD-10-CM | POA: Diagnosis not present

## 2019-04-29 DIAGNOSIS — M4316 Spondylolisthesis, lumbar region: Secondary | ICD-10-CM | POA: Diagnosis not present

## 2019-04-29 DIAGNOSIS — M5416 Radiculopathy, lumbar region: Secondary | ICD-10-CM | POA: Diagnosis not present

## 2019-05-01 DIAGNOSIS — Z1231 Encounter for screening mammogram for malignant neoplasm of breast: Secondary | ICD-10-CM | POA: Diagnosis not present

## 2019-05-07 DIAGNOSIS — M47816 Spondylosis without myelopathy or radiculopathy, lumbar region: Secondary | ICD-10-CM | POA: Diagnosis not present

## 2019-05-07 DIAGNOSIS — Z1389 Encounter for screening for other disorder: Secondary | ICD-10-CM | POA: Diagnosis not present

## 2019-05-07 DIAGNOSIS — G894 Chronic pain syndrome: Secondary | ICD-10-CM | POA: Diagnosis not present

## 2019-05-07 DIAGNOSIS — M5136 Other intervertebral disc degeneration, lumbar region: Secondary | ICD-10-CM | POA: Diagnosis not present

## 2019-05-07 DIAGNOSIS — Z96651 Presence of right artificial knee joint: Secondary | ICD-10-CM | POA: Diagnosis not present

## 2019-05-07 DIAGNOSIS — Z96652 Presence of left artificial knee joint: Secondary | ICD-10-CM | POA: Diagnosis not present

## 2019-05-07 DIAGNOSIS — Z79891 Long term (current) use of opiate analgesic: Secondary | ICD-10-CM | POA: Diagnosis not present

## 2019-05-22 DIAGNOSIS — I10 Essential (primary) hypertension: Secondary | ICD-10-CM | POA: Diagnosis not present

## 2019-05-22 DIAGNOSIS — E119 Type 2 diabetes mellitus without complications: Secondary | ICD-10-CM | POA: Diagnosis not present

## 2019-05-22 DIAGNOSIS — J449 Chronic obstructive pulmonary disease, unspecified: Secondary | ICD-10-CM | POA: Diagnosis not present

## 2019-06-08 DIAGNOSIS — Z96651 Presence of right artificial knee joint: Secondary | ICD-10-CM | POA: Diagnosis not present

## 2019-06-08 DIAGNOSIS — Z79891 Long term (current) use of opiate analgesic: Secondary | ICD-10-CM | POA: Diagnosis not present

## 2019-06-08 DIAGNOSIS — Z1389 Encounter for screening for other disorder: Secondary | ICD-10-CM | POA: Diagnosis not present

## 2019-06-08 DIAGNOSIS — M4316 Spondylolisthesis, lumbar region: Secondary | ICD-10-CM | POA: Diagnosis not present

## 2019-06-08 DIAGNOSIS — M5416 Radiculopathy, lumbar region: Secondary | ICD-10-CM | POA: Diagnosis not present

## 2019-06-08 DIAGNOSIS — G894 Chronic pain syndrome: Secondary | ICD-10-CM | POA: Diagnosis not present

## 2019-06-08 DIAGNOSIS — Z96652 Presence of left artificial knee joint: Secondary | ICD-10-CM | POA: Diagnosis not present

## 2019-06-08 DIAGNOSIS — M5136 Other intervertebral disc degeneration, lumbar region: Secondary | ICD-10-CM | POA: Diagnosis not present

## 2019-06-09 DIAGNOSIS — M4316 Spondylolisthesis, lumbar region: Secondary | ICD-10-CM | POA: Diagnosis not present

## 2019-06-09 DIAGNOSIS — M5416 Radiculopathy, lumbar region: Secondary | ICD-10-CM | POA: Diagnosis not present

## 2019-06-16 DIAGNOSIS — G4730 Sleep apnea, unspecified: Secondary | ICD-10-CM | POA: Diagnosis not present

## 2019-06-16 DIAGNOSIS — J4540 Moderate persistent asthma, uncomplicated: Secondary | ICD-10-CM | POA: Diagnosis not present

## 2019-06-16 DIAGNOSIS — J301 Allergic rhinitis due to pollen: Secondary | ICD-10-CM | POA: Diagnosis not present

## 2019-06-16 DIAGNOSIS — F17210 Nicotine dependence, cigarettes, uncomplicated: Secondary | ICD-10-CM | POA: Diagnosis not present

## 2019-07-06 DIAGNOSIS — M5416 Radiculopathy, lumbar region: Secondary | ICD-10-CM | POA: Diagnosis not present

## 2019-07-06 DIAGNOSIS — M5136 Other intervertebral disc degeneration, lumbar region: Secondary | ICD-10-CM | POA: Diagnosis not present

## 2019-07-06 DIAGNOSIS — Z96651 Presence of right artificial knee joint: Secondary | ICD-10-CM | POA: Diagnosis not present

## 2019-07-06 DIAGNOSIS — Z96652 Presence of left artificial knee joint: Secondary | ICD-10-CM | POA: Diagnosis not present

## 2019-07-06 DIAGNOSIS — Z1389 Encounter for screening for other disorder: Secondary | ICD-10-CM | POA: Diagnosis not present

## 2019-07-06 DIAGNOSIS — M4316 Spondylolisthesis, lumbar region: Secondary | ICD-10-CM | POA: Diagnosis not present

## 2019-07-06 DIAGNOSIS — G894 Chronic pain syndrome: Secondary | ICD-10-CM | POA: Diagnosis not present

## 2019-07-06 DIAGNOSIS — Z79891 Long term (current) use of opiate analgesic: Secondary | ICD-10-CM | POA: Diagnosis not present

## 2019-07-21 DIAGNOSIS — M4316 Spondylolisthesis, lumbar region: Secondary | ICD-10-CM | POA: Diagnosis not present

## 2019-07-31 DIAGNOSIS — H5203 Hypermetropia, bilateral: Secondary | ICD-10-CM | POA: Diagnosis not present

## 2019-07-31 DIAGNOSIS — H5213 Myopia, bilateral: Secondary | ICD-10-CM | POA: Diagnosis not present

## 2019-07-31 DIAGNOSIS — H524 Presbyopia: Secondary | ICD-10-CM | POA: Diagnosis not present

## 2019-07-31 DIAGNOSIS — H5201 Hypermetropia, right eye: Secondary | ICD-10-CM | POA: Diagnosis not present

## 2019-07-31 DIAGNOSIS — H52209 Unspecified astigmatism, unspecified eye: Secondary | ICD-10-CM | POA: Diagnosis not present

## 2019-08-03 DIAGNOSIS — Z96651 Presence of right artificial knee joint: Secondary | ICD-10-CM | POA: Diagnosis not present

## 2019-08-03 DIAGNOSIS — Z96652 Presence of left artificial knee joint: Secondary | ICD-10-CM | POA: Diagnosis not present

## 2019-08-03 DIAGNOSIS — Z79891 Long term (current) use of opiate analgesic: Secondary | ICD-10-CM | POA: Diagnosis not present

## 2019-08-03 DIAGNOSIS — M5416 Radiculopathy, lumbar region: Secondary | ICD-10-CM | POA: Diagnosis not present

## 2019-08-03 DIAGNOSIS — M4316 Spondylolisthesis, lumbar region: Secondary | ICD-10-CM | POA: Diagnosis not present

## 2019-08-03 DIAGNOSIS — M5136 Other intervertebral disc degeneration, lumbar region: Secondary | ICD-10-CM | POA: Diagnosis not present

## 2019-08-03 DIAGNOSIS — G894 Chronic pain syndrome: Secondary | ICD-10-CM | POA: Diagnosis not present

## 2019-08-17 DIAGNOSIS — D500 Iron deficiency anemia secondary to blood loss (chronic): Secondary | ICD-10-CM | POA: Diagnosis not present

## 2019-08-21 DIAGNOSIS — D500 Iron deficiency anemia secondary to blood loss (chronic): Secondary | ICD-10-CM | POA: Diagnosis not present

## 2019-08-27 DIAGNOSIS — D500 Iron deficiency anemia secondary to blood loss (chronic): Secondary | ICD-10-CM | POA: Diagnosis not present

## 2019-08-27 DIAGNOSIS — K219 Gastro-esophageal reflux disease without esophagitis: Secondary | ICD-10-CM | POA: Diagnosis not present

## 2019-08-27 DIAGNOSIS — R195 Other fecal abnormalities: Secondary | ICD-10-CM | POA: Diagnosis not present

## 2019-08-27 DIAGNOSIS — K589 Irritable bowel syndrome without diarrhea: Secondary | ICD-10-CM | POA: Diagnosis not present

## 2019-08-31 DIAGNOSIS — M5416 Radiculopathy, lumbar region: Secondary | ICD-10-CM | POA: Diagnosis not present

## 2019-08-31 DIAGNOSIS — G894 Chronic pain syndrome: Secondary | ICD-10-CM | POA: Diagnosis not present

## 2019-08-31 DIAGNOSIS — Z96651 Presence of right artificial knee joint: Secondary | ICD-10-CM | POA: Diagnosis not present

## 2019-08-31 DIAGNOSIS — Z79891 Long term (current) use of opiate analgesic: Secondary | ICD-10-CM | POA: Diagnosis not present

## 2019-08-31 DIAGNOSIS — M5136 Other intervertebral disc degeneration, lumbar region: Secondary | ICD-10-CM | POA: Diagnosis not present

## 2019-08-31 DIAGNOSIS — Z96652 Presence of left artificial knee joint: Secondary | ICD-10-CM | POA: Diagnosis not present

## 2019-08-31 DIAGNOSIS — M4316 Spondylolisthesis, lumbar region: Secondary | ICD-10-CM | POA: Diagnosis not present

## 2019-09-08 DIAGNOSIS — M5416 Radiculopathy, lumbar region: Secondary | ICD-10-CM | POA: Diagnosis not present

## 2019-09-09 DIAGNOSIS — J4540 Moderate persistent asthma, uncomplicated: Secondary | ICD-10-CM | POA: Diagnosis not present

## 2019-09-17 DIAGNOSIS — Z6828 Body mass index (BMI) 28.0-28.9, adult: Secondary | ICD-10-CM | POA: Diagnosis not present

## 2019-09-17 DIAGNOSIS — Z1331 Encounter for screening for depression: Secondary | ICD-10-CM | POA: Diagnosis not present

## 2019-09-17 DIAGNOSIS — R102 Pelvic and perineal pain: Secondary | ICD-10-CM | POA: Diagnosis not present

## 2019-09-17 DIAGNOSIS — J449 Chronic obstructive pulmonary disease, unspecified: Secondary | ICD-10-CM | POA: Diagnosis not present

## 2019-09-17 DIAGNOSIS — R232 Flushing: Secondary | ICD-10-CM | POA: Diagnosis not present

## 2019-09-17 DIAGNOSIS — E119 Type 2 diabetes mellitus without complications: Secondary | ICD-10-CM | POA: Diagnosis not present

## 2019-09-20 DIAGNOSIS — I10 Essential (primary) hypertension: Secondary | ICD-10-CM | POA: Diagnosis not present

## 2019-09-20 DIAGNOSIS — E119 Type 2 diabetes mellitus without complications: Secondary | ICD-10-CM | POA: Diagnosis not present

## 2019-09-20 DIAGNOSIS — J449 Chronic obstructive pulmonary disease, unspecified: Secondary | ICD-10-CM | POA: Diagnosis not present

## 2019-09-23 DIAGNOSIS — R102 Pelvic and perineal pain: Secondary | ICD-10-CM | POA: Diagnosis not present

## 2019-09-23 DIAGNOSIS — R1032 Left lower quadrant pain: Secondary | ICD-10-CM | POA: Diagnosis not present

## 2019-10-01 DIAGNOSIS — M4316 Spondylolisthesis, lumbar region: Secondary | ICD-10-CM | POA: Diagnosis not present

## 2019-10-01 DIAGNOSIS — Z79891 Long term (current) use of opiate analgesic: Secondary | ICD-10-CM | POA: Diagnosis not present

## 2019-10-01 DIAGNOSIS — Z96652 Presence of left artificial knee joint: Secondary | ICD-10-CM | POA: Diagnosis not present

## 2019-10-01 DIAGNOSIS — G894 Chronic pain syndrome: Secondary | ICD-10-CM | POA: Diagnosis not present

## 2019-10-01 DIAGNOSIS — M5136 Other intervertebral disc degeneration, lumbar region: Secondary | ICD-10-CM | POA: Diagnosis not present

## 2019-10-01 DIAGNOSIS — Z96651 Presence of right artificial knee joint: Secondary | ICD-10-CM | POA: Diagnosis not present

## 2019-10-01 DIAGNOSIS — M5416 Radiculopathy, lumbar region: Secondary | ICD-10-CM | POA: Diagnosis not present

## 2019-10-07 DIAGNOSIS — M25552 Pain in left hip: Secondary | ICD-10-CM | POA: Diagnosis not present

## 2019-10-21 DIAGNOSIS — L259 Unspecified contact dermatitis, unspecified cause: Secondary | ICD-10-CM | POA: Diagnosis not present

## 2019-10-21 DIAGNOSIS — Z6828 Body mass index (BMI) 28.0-28.9, adult: Secondary | ICD-10-CM | POA: Diagnosis not present

## 2019-10-29 DIAGNOSIS — Z79891 Long term (current) use of opiate analgesic: Secondary | ICD-10-CM | POA: Diagnosis not present

## 2019-10-29 DIAGNOSIS — M5416 Radiculopathy, lumbar region: Secondary | ICD-10-CM | POA: Diagnosis not present

## 2019-10-29 DIAGNOSIS — G894 Chronic pain syndrome: Secondary | ICD-10-CM | POA: Diagnosis not present

## 2019-10-29 DIAGNOSIS — Z96652 Presence of left artificial knee joint: Secondary | ICD-10-CM | POA: Diagnosis not present

## 2019-10-29 DIAGNOSIS — M4316 Spondylolisthesis, lumbar region: Secondary | ICD-10-CM | POA: Diagnosis not present

## 2019-10-29 DIAGNOSIS — M5136 Other intervertebral disc degeneration, lumbar region: Secondary | ICD-10-CM | POA: Diagnosis not present

## 2019-10-29 DIAGNOSIS — Z96651 Presence of right artificial knee joint: Secondary | ICD-10-CM | POA: Diagnosis not present

## 2019-10-29 DIAGNOSIS — M48061 Spinal stenosis, lumbar region without neurogenic claudication: Secondary | ICD-10-CM | POA: Diagnosis not present

## 2019-11-26 DIAGNOSIS — Z96651 Presence of right artificial knee joint: Secondary | ICD-10-CM | POA: Diagnosis not present

## 2019-11-26 DIAGNOSIS — M5416 Radiculopathy, lumbar region: Secondary | ICD-10-CM | POA: Diagnosis not present

## 2019-11-26 DIAGNOSIS — G894 Chronic pain syndrome: Secondary | ICD-10-CM | POA: Diagnosis not present

## 2019-11-26 DIAGNOSIS — M4316 Spondylolisthesis, lumbar region: Secondary | ICD-10-CM | POA: Diagnosis not present

## 2019-11-26 DIAGNOSIS — Z96652 Presence of left artificial knee joint: Secondary | ICD-10-CM | POA: Diagnosis not present

## 2019-11-26 DIAGNOSIS — Z79891 Long term (current) use of opiate analgesic: Secondary | ICD-10-CM | POA: Diagnosis not present

## 2019-11-26 DIAGNOSIS — M5136 Other intervertebral disc degeneration, lumbar region: Secondary | ICD-10-CM | POA: Diagnosis not present

## 2019-11-26 DIAGNOSIS — M48061 Spinal stenosis, lumbar region without neurogenic claudication: Secondary | ICD-10-CM | POA: Diagnosis not present

## 2019-12-02 DIAGNOSIS — G4730 Sleep apnea, unspecified: Secondary | ICD-10-CM | POA: Diagnosis not present

## 2019-12-02 DIAGNOSIS — J301 Allergic rhinitis due to pollen: Secondary | ICD-10-CM | POA: Diagnosis not present

## 2019-12-02 DIAGNOSIS — J4540 Moderate persistent asthma, uncomplicated: Secondary | ICD-10-CM | POA: Diagnosis not present

## 2019-12-02 DIAGNOSIS — F17210 Nicotine dependence, cigarettes, uncomplicated: Secondary | ICD-10-CM | POA: Diagnosis not present

## 2019-12-21 DIAGNOSIS — I10 Essential (primary) hypertension: Secondary | ICD-10-CM | POA: Diagnosis not present

## 2019-12-21 DIAGNOSIS — E785 Hyperlipidemia, unspecified: Secondary | ICD-10-CM | POA: Diagnosis not present

## 2019-12-21 DIAGNOSIS — K219 Gastro-esophageal reflux disease without esophagitis: Secondary | ICD-10-CM | POA: Diagnosis not present

## 2019-12-28 DIAGNOSIS — M48061 Spinal stenosis, lumbar region without neurogenic claudication: Secondary | ICD-10-CM | POA: Diagnosis not present

## 2019-12-28 DIAGNOSIS — Z79891 Long term (current) use of opiate analgesic: Secondary | ICD-10-CM | POA: Diagnosis not present

## 2019-12-28 DIAGNOSIS — M4316 Spondylolisthesis, lumbar region: Secondary | ICD-10-CM | POA: Diagnosis not present

## 2019-12-28 DIAGNOSIS — M5416 Radiculopathy, lumbar region: Secondary | ICD-10-CM | POA: Diagnosis not present

## 2019-12-28 DIAGNOSIS — Z96651 Presence of right artificial knee joint: Secondary | ICD-10-CM | POA: Diagnosis not present

## 2019-12-28 DIAGNOSIS — M5136 Other intervertebral disc degeneration, lumbar region: Secondary | ICD-10-CM | POA: Diagnosis not present

## 2019-12-28 DIAGNOSIS — Z96652 Presence of left artificial knee joint: Secondary | ICD-10-CM | POA: Diagnosis not present

## 2019-12-28 DIAGNOSIS — G894 Chronic pain syndrome: Secondary | ICD-10-CM | POA: Diagnosis not present

## 2020-01-20 DIAGNOSIS — E785 Hyperlipidemia, unspecified: Secondary | ICD-10-CM | POA: Diagnosis not present

## 2020-01-20 DIAGNOSIS — I10 Essential (primary) hypertension: Secondary | ICD-10-CM | POA: Diagnosis not present

## 2020-01-20 DIAGNOSIS — K219 Gastro-esophageal reflux disease without esophagitis: Secondary | ICD-10-CM | POA: Diagnosis not present

## 2020-01-22 DIAGNOSIS — M4316 Spondylolisthesis, lumbar region: Secondary | ICD-10-CM | POA: Diagnosis not present

## 2020-01-22 DIAGNOSIS — Z96651 Presence of right artificial knee joint: Secondary | ICD-10-CM | POA: Diagnosis not present

## 2020-01-22 DIAGNOSIS — G2581 Restless legs syndrome: Secondary | ICD-10-CM | POA: Diagnosis not present

## 2020-01-22 DIAGNOSIS — M48061 Spinal stenosis, lumbar region without neurogenic claudication: Secondary | ICD-10-CM | POA: Diagnosis not present

## 2020-01-22 DIAGNOSIS — M461 Sacroiliitis, not elsewhere classified: Secondary | ICD-10-CM | POA: Diagnosis not present

## 2020-01-22 DIAGNOSIS — Z1389 Encounter for screening for other disorder: Secondary | ICD-10-CM | POA: Diagnosis not present

## 2020-01-22 DIAGNOSIS — M5416 Radiculopathy, lumbar region: Secondary | ICD-10-CM | POA: Diagnosis not present

## 2020-01-22 DIAGNOSIS — G894 Chronic pain syndrome: Secondary | ICD-10-CM | POA: Diagnosis not present

## 2020-01-22 DIAGNOSIS — Z96652 Presence of left artificial knee joint: Secondary | ICD-10-CM | POA: Diagnosis not present

## 2020-02-10 DIAGNOSIS — M461 Sacroiliitis, not elsewhere classified: Secondary | ICD-10-CM | POA: Diagnosis not present

## 2020-02-10 DIAGNOSIS — G894 Chronic pain syndrome: Secondary | ICD-10-CM | POA: Diagnosis not present

## 2020-02-20 DIAGNOSIS — K219 Gastro-esophageal reflux disease without esophagitis: Secondary | ICD-10-CM | POA: Diagnosis not present

## 2020-02-20 DIAGNOSIS — E785 Hyperlipidemia, unspecified: Secondary | ICD-10-CM | POA: Diagnosis not present

## 2020-02-20 DIAGNOSIS — I10 Essential (primary) hypertension: Secondary | ICD-10-CM | POA: Diagnosis not present

## 2020-02-26 DIAGNOSIS — G2581 Restless legs syndrome: Secondary | ICD-10-CM | POA: Diagnosis not present

## 2020-02-26 DIAGNOSIS — M48061 Spinal stenosis, lumbar region without neurogenic claudication: Secondary | ICD-10-CM | POA: Diagnosis not present

## 2020-02-26 DIAGNOSIS — M4316 Spondylolisthesis, lumbar region: Secondary | ICD-10-CM | POA: Diagnosis not present

## 2020-02-26 DIAGNOSIS — Z96651 Presence of right artificial knee joint: Secondary | ICD-10-CM | POA: Diagnosis not present

## 2020-02-26 DIAGNOSIS — Z96652 Presence of left artificial knee joint: Secondary | ICD-10-CM | POA: Diagnosis not present

## 2020-02-26 DIAGNOSIS — Z1389 Encounter for screening for other disorder: Secondary | ICD-10-CM | POA: Diagnosis not present

## 2020-02-26 DIAGNOSIS — M5416 Radiculopathy, lumbar region: Secondary | ICD-10-CM | POA: Diagnosis not present

## 2020-02-26 DIAGNOSIS — G894 Chronic pain syndrome: Secondary | ICD-10-CM | POA: Diagnosis not present

## 2020-02-26 DIAGNOSIS — M461 Sacroiliitis, not elsewhere classified: Secondary | ICD-10-CM | POA: Diagnosis not present

## 2020-03-08 DIAGNOSIS — J4540 Moderate persistent asthma, uncomplicated: Secondary | ICD-10-CM | POA: Diagnosis not present

## 2020-03-08 DIAGNOSIS — F17210 Nicotine dependence, cigarettes, uncomplicated: Secondary | ICD-10-CM | POA: Diagnosis not present

## 2020-03-08 DIAGNOSIS — G4730 Sleep apnea, unspecified: Secondary | ICD-10-CM | POA: Diagnosis not present

## 2020-03-08 DIAGNOSIS — J301 Allergic rhinitis due to pollen: Secondary | ICD-10-CM | POA: Diagnosis not present

## 2020-03-22 DIAGNOSIS — I10 Essential (primary) hypertension: Secondary | ICD-10-CM | POA: Diagnosis not present

## 2020-03-22 DIAGNOSIS — K219 Gastro-esophageal reflux disease without esophagitis: Secondary | ICD-10-CM | POA: Diagnosis not present

## 2020-03-22 DIAGNOSIS — E785 Hyperlipidemia, unspecified: Secondary | ICD-10-CM | POA: Diagnosis not present

## 2020-03-25 DIAGNOSIS — Z1389 Encounter for screening for other disorder: Secondary | ICD-10-CM | POA: Diagnosis not present

## 2020-03-25 DIAGNOSIS — M4316 Spondylolisthesis, lumbar region: Secondary | ICD-10-CM | POA: Diagnosis not present

## 2020-03-25 DIAGNOSIS — G894 Chronic pain syndrome: Secondary | ICD-10-CM | POA: Diagnosis not present

## 2020-03-25 DIAGNOSIS — M461 Sacroiliitis, not elsewhere classified: Secondary | ICD-10-CM | POA: Diagnosis not present

## 2020-03-25 DIAGNOSIS — Z96651 Presence of right artificial knee joint: Secondary | ICD-10-CM | POA: Diagnosis not present

## 2020-03-25 DIAGNOSIS — Z96652 Presence of left artificial knee joint: Secondary | ICD-10-CM | POA: Diagnosis not present

## 2020-03-25 DIAGNOSIS — M48061 Spinal stenosis, lumbar region without neurogenic claudication: Secondary | ICD-10-CM | POA: Diagnosis not present

## 2020-03-25 DIAGNOSIS — G2581 Restless legs syndrome: Secondary | ICD-10-CM | POA: Diagnosis not present

## 2020-03-25 DIAGNOSIS — M5416 Radiculopathy, lumbar region: Secondary | ICD-10-CM | POA: Diagnosis not present

## 2020-03-30 DIAGNOSIS — D500 Iron deficiency anemia secondary to blood loss (chronic): Secondary | ICD-10-CM | POA: Diagnosis not present

## 2020-04-05 DIAGNOSIS — K219 Gastro-esophageal reflux disease without esophagitis: Secondary | ICD-10-CM | POA: Diagnosis not present

## 2020-04-05 DIAGNOSIS — K581 Irritable bowel syndrome with constipation: Secondary | ICD-10-CM | POA: Diagnosis not present

## 2020-04-05 DIAGNOSIS — D500 Iron deficiency anemia secondary to blood loss (chronic): Secondary | ICD-10-CM | POA: Diagnosis not present

## 2020-04-09 DIAGNOSIS — J4540 Moderate persistent asthma, uncomplicated: Secondary | ICD-10-CM | POA: Diagnosis not present

## 2020-04-25 DIAGNOSIS — M5416 Radiculopathy, lumbar region: Secondary | ICD-10-CM | POA: Diagnosis not present

## 2020-04-25 DIAGNOSIS — M461 Sacroiliitis, not elsewhere classified: Secondary | ICD-10-CM | POA: Diagnosis not present

## 2020-04-25 DIAGNOSIS — G894 Chronic pain syndrome: Secondary | ICD-10-CM | POA: Diagnosis not present

## 2020-04-25 DIAGNOSIS — Z79891 Long term (current) use of opiate analgesic: Secondary | ICD-10-CM | POA: Diagnosis not present

## 2020-04-25 DIAGNOSIS — M4316 Spondylolisthesis, lumbar region: Secondary | ICD-10-CM | POA: Diagnosis not present

## 2020-04-25 DIAGNOSIS — Z96651 Presence of right artificial knee joint: Secondary | ICD-10-CM | POA: Diagnosis not present

## 2020-04-25 DIAGNOSIS — M48061 Spinal stenosis, lumbar region without neurogenic claudication: Secondary | ICD-10-CM | POA: Diagnosis not present

## 2020-04-25 DIAGNOSIS — Z96652 Presence of left artificial knee joint: Secondary | ICD-10-CM | POA: Diagnosis not present

## 2020-04-25 DIAGNOSIS — G2581 Restless legs syndrome: Secondary | ICD-10-CM | POA: Diagnosis not present

## 2020-05-05 DIAGNOSIS — E119 Type 2 diabetes mellitus without complications: Secondary | ICD-10-CM | POA: Diagnosis not present

## 2020-05-05 DIAGNOSIS — E559 Vitamin D deficiency, unspecified: Secondary | ICD-10-CM | POA: Diagnosis not present

## 2020-05-05 DIAGNOSIS — R5383 Other fatigue: Secondary | ICD-10-CM | POA: Diagnosis not present

## 2020-05-05 DIAGNOSIS — M8580 Other specified disorders of bone density and structure, unspecified site: Secondary | ICD-10-CM | POA: Diagnosis not present

## 2020-05-05 DIAGNOSIS — Z23 Encounter for immunization: Secondary | ICD-10-CM | POA: Diagnosis not present

## 2020-05-05 DIAGNOSIS — Z0000 Encounter for general adult medical examination without abnormal findings: Secondary | ICD-10-CM | POA: Diagnosis not present

## 2020-05-05 DIAGNOSIS — F410 Panic disorder [episodic paroxysmal anxiety] without agoraphobia: Secondary | ICD-10-CM | POA: Diagnosis not present

## 2020-05-05 DIAGNOSIS — Z79899 Other long term (current) drug therapy: Secondary | ICD-10-CM | POA: Diagnosis not present

## 2020-05-05 DIAGNOSIS — Z6829 Body mass index (BMI) 29.0-29.9, adult: Secondary | ICD-10-CM | POA: Diagnosis not present

## 2020-05-09 DIAGNOSIS — J4540 Moderate persistent asthma, uncomplicated: Secondary | ICD-10-CM | POA: Diagnosis not present

## 2020-05-21 DIAGNOSIS — K219 Gastro-esophageal reflux disease without esophagitis: Secondary | ICD-10-CM | POA: Diagnosis not present

## 2020-05-21 DIAGNOSIS — E785 Hyperlipidemia, unspecified: Secondary | ICD-10-CM | POA: Diagnosis not present

## 2020-05-21 DIAGNOSIS — I10 Essential (primary) hypertension: Secondary | ICD-10-CM | POA: Diagnosis not present

## 2020-05-24 DIAGNOSIS — Z1231 Encounter for screening mammogram for malignant neoplasm of breast: Secondary | ICD-10-CM | POA: Diagnosis not present

## 2020-05-26 DIAGNOSIS — Z96652 Presence of left artificial knee joint: Secondary | ICD-10-CM | POA: Diagnosis not present

## 2020-05-26 DIAGNOSIS — G2581 Restless legs syndrome: Secondary | ICD-10-CM | POA: Diagnosis not present

## 2020-05-26 DIAGNOSIS — M461 Sacroiliitis, not elsewhere classified: Secondary | ICD-10-CM | POA: Diagnosis not present

## 2020-05-26 DIAGNOSIS — M5416 Radiculopathy, lumbar region: Secondary | ICD-10-CM | POA: Diagnosis not present

## 2020-05-26 DIAGNOSIS — M48061 Spinal stenosis, lumbar region without neurogenic claudication: Secondary | ICD-10-CM | POA: Diagnosis not present

## 2020-05-26 DIAGNOSIS — Z96651 Presence of right artificial knee joint: Secondary | ICD-10-CM | POA: Diagnosis not present

## 2020-05-26 DIAGNOSIS — G894 Chronic pain syndrome: Secondary | ICD-10-CM | POA: Diagnosis not present

## 2020-05-26 DIAGNOSIS — Z79891 Long term (current) use of opiate analgesic: Secondary | ICD-10-CM | POA: Diagnosis not present

## 2020-05-26 DIAGNOSIS — M4316 Spondylolisthesis, lumbar region: Secondary | ICD-10-CM | POA: Diagnosis not present

## 2020-06-07 DIAGNOSIS — J301 Allergic rhinitis due to pollen: Secondary | ICD-10-CM | POA: Diagnosis not present

## 2020-06-07 DIAGNOSIS — J4540 Moderate persistent asthma, uncomplicated: Secondary | ICD-10-CM | POA: Diagnosis not present

## 2020-06-07 DIAGNOSIS — F17210 Nicotine dependence, cigarettes, uncomplicated: Secondary | ICD-10-CM | POA: Diagnosis not present

## 2020-06-07 DIAGNOSIS — G4730 Sleep apnea, unspecified: Secondary | ICD-10-CM | POA: Diagnosis not present

## 2020-06-07 DIAGNOSIS — Z7189 Other specified counseling: Secondary | ICD-10-CM | POA: Diagnosis not present

## 2020-06-09 DIAGNOSIS — J4540 Moderate persistent asthma, uncomplicated: Secondary | ICD-10-CM | POA: Diagnosis not present

## 2020-06-15 DIAGNOSIS — M461 Sacroiliitis, not elsewhere classified: Secondary | ICD-10-CM | POA: Diagnosis not present

## 2020-06-15 DIAGNOSIS — M533 Sacrococcygeal disorders, not elsewhere classified: Secondary | ICD-10-CM | POA: Diagnosis not present

## 2020-06-24 DIAGNOSIS — M5416 Radiculopathy, lumbar region: Secondary | ICD-10-CM | POA: Diagnosis not present

## 2020-06-24 DIAGNOSIS — Z96652 Presence of left artificial knee joint: Secondary | ICD-10-CM | POA: Diagnosis not present

## 2020-06-24 DIAGNOSIS — M461 Sacroiliitis, not elsewhere classified: Secondary | ICD-10-CM | POA: Diagnosis not present

## 2020-06-24 DIAGNOSIS — Z96651 Presence of right artificial knee joint: Secondary | ICD-10-CM | POA: Diagnosis not present

## 2020-06-24 DIAGNOSIS — G894 Chronic pain syndrome: Secondary | ICD-10-CM | POA: Diagnosis not present

## 2020-06-24 DIAGNOSIS — M48061 Spinal stenosis, lumbar region without neurogenic claudication: Secondary | ICD-10-CM | POA: Diagnosis not present

## 2020-06-24 DIAGNOSIS — M7062 Trochanteric bursitis, left hip: Secondary | ICD-10-CM | POA: Diagnosis not present

## 2020-06-24 DIAGNOSIS — G2581 Restless legs syndrome: Secondary | ICD-10-CM | POA: Diagnosis not present

## 2020-06-24 DIAGNOSIS — M4316 Spondylolisthesis, lumbar region: Secondary | ICD-10-CM | POA: Diagnosis not present

## 2020-06-29 DIAGNOSIS — M7062 Trochanteric bursitis, left hip: Secondary | ICD-10-CM | POA: Diagnosis not present

## 2020-07-09 DIAGNOSIS — J4540 Moderate persistent asthma, uncomplicated: Secondary | ICD-10-CM | POA: Diagnosis not present

## 2020-07-28 DIAGNOSIS — M7062 Trochanteric bursitis, left hip: Secondary | ICD-10-CM | POA: Diagnosis not present

## 2020-07-28 DIAGNOSIS — G894 Chronic pain syndrome: Secondary | ICD-10-CM | POA: Diagnosis not present

## 2020-07-28 DIAGNOSIS — M48061 Spinal stenosis, lumbar region without neurogenic claudication: Secondary | ICD-10-CM | POA: Diagnosis not present

## 2020-07-28 DIAGNOSIS — M4316 Spondylolisthesis, lumbar region: Secondary | ICD-10-CM | POA: Diagnosis not present

## 2020-08-17 DIAGNOSIS — M7062 Trochanteric bursitis, left hip: Secondary | ICD-10-CM | POA: Diagnosis not present

## 2020-08-17 DIAGNOSIS — M4316 Spondylolisthesis, lumbar region: Secondary | ICD-10-CM | POA: Diagnosis not present

## 2020-08-17 DIAGNOSIS — G894 Chronic pain syndrome: Secondary | ICD-10-CM | POA: Diagnosis not present

## 2020-08-17 DIAGNOSIS — M48061 Spinal stenosis, lumbar region without neurogenic claudication: Secondary | ICD-10-CM | POA: Diagnosis not present

## 2020-08-30 DIAGNOSIS — G4730 Sleep apnea, unspecified: Secondary | ICD-10-CM | POA: Diagnosis not present

## 2020-08-31 DIAGNOSIS — J301 Allergic rhinitis due to pollen: Secondary | ICD-10-CM | POA: Diagnosis not present

## 2020-08-31 DIAGNOSIS — G4730 Sleep apnea, unspecified: Secondary | ICD-10-CM | POA: Diagnosis not present

## 2020-08-31 DIAGNOSIS — F17210 Nicotine dependence, cigarettes, uncomplicated: Secondary | ICD-10-CM | POA: Diagnosis not present

## 2020-08-31 DIAGNOSIS — J4540 Moderate persistent asthma, uncomplicated: Secondary | ICD-10-CM | POA: Diagnosis not present

## 2020-09-01 DIAGNOSIS — M461 Sacroiliitis, not elsewhere classified: Secondary | ICD-10-CM | POA: Insufficient documentation

## 2020-09-01 HISTORY — DX: Sacroiliitis, not elsewhere classified: M46.1

## 2020-09-07 DIAGNOSIS — M461 Sacroiliitis, not elsewhere classified: Secondary | ICD-10-CM | POA: Diagnosis not present

## 2020-09-07 DIAGNOSIS — G894 Chronic pain syndrome: Secondary | ICD-10-CM | POA: Diagnosis not present

## 2020-09-19 DIAGNOSIS — M7062 Trochanteric bursitis, left hip: Secondary | ICD-10-CM | POA: Diagnosis not present

## 2020-09-19 DIAGNOSIS — G894 Chronic pain syndrome: Secondary | ICD-10-CM | POA: Diagnosis not present

## 2020-09-19 DIAGNOSIS — M4316 Spondylolisthesis, lumbar region: Secondary | ICD-10-CM | POA: Diagnosis not present

## 2020-09-19 DIAGNOSIS — M48061 Spinal stenosis, lumbar region without neurogenic claudication: Secondary | ICD-10-CM | POA: Diagnosis not present

## 2020-09-23 DIAGNOSIS — M6281 Muscle weakness (generalized): Secondary | ICD-10-CM | POA: Diagnosis not present

## 2020-09-23 DIAGNOSIS — M545 Low back pain, unspecified: Secondary | ICD-10-CM | POA: Diagnosis not present

## 2020-09-23 DIAGNOSIS — R262 Difficulty in walking, not elsewhere classified: Secondary | ICD-10-CM | POA: Diagnosis not present

## 2020-09-29 DIAGNOSIS — R0683 Snoring: Secondary | ICD-10-CM | POA: Diagnosis not present

## 2020-09-29 DIAGNOSIS — Z711 Person with feared health complaint in whom no diagnosis is made: Secondary | ICD-10-CM | POA: Diagnosis not present

## 2020-09-29 DIAGNOSIS — G4733 Obstructive sleep apnea (adult) (pediatric): Secondary | ICD-10-CM | POA: Diagnosis not present

## 2020-09-30 DIAGNOSIS — R262 Difficulty in walking, not elsewhere classified: Secondary | ICD-10-CM | POA: Diagnosis not present

## 2020-09-30 DIAGNOSIS — M545 Low back pain, unspecified: Secondary | ICD-10-CM | POA: Diagnosis not present

## 2020-09-30 DIAGNOSIS — M6281 Muscle weakness (generalized): Secondary | ICD-10-CM | POA: Diagnosis not present

## 2020-10-10 DIAGNOSIS — J4540 Moderate persistent asthma, uncomplicated: Secondary | ICD-10-CM | POA: Diagnosis not present

## 2020-10-10 DIAGNOSIS — F17210 Nicotine dependence, cigarettes, uncomplicated: Secondary | ICD-10-CM | POA: Diagnosis not present

## 2020-10-10 DIAGNOSIS — J301 Allergic rhinitis due to pollen: Secondary | ICD-10-CM | POA: Diagnosis not present

## 2020-10-21 DIAGNOSIS — M5416 Radiculopathy, lumbar region: Secondary | ICD-10-CM | POA: Diagnosis not present

## 2020-10-21 DIAGNOSIS — M48061 Spinal stenosis, lumbar region without neurogenic claudication: Secondary | ICD-10-CM | POA: Diagnosis not present

## 2020-10-21 DIAGNOSIS — M4316 Spondylolisthesis, lumbar region: Secondary | ICD-10-CM | POA: Diagnosis not present

## 2020-10-21 DIAGNOSIS — M7062 Trochanteric bursitis, left hip: Secondary | ICD-10-CM | POA: Diagnosis not present

## 2020-10-21 DIAGNOSIS — M461 Sacroiliitis, not elsewhere classified: Secondary | ICD-10-CM | POA: Diagnosis not present

## 2020-10-21 DIAGNOSIS — G894 Chronic pain syndrome: Secondary | ICD-10-CM | POA: Diagnosis not present

## 2020-10-21 DIAGNOSIS — G2581 Restless legs syndrome: Secondary | ICD-10-CM | POA: Diagnosis not present

## 2020-10-21 DIAGNOSIS — Z96651 Presence of right artificial knee joint: Secondary | ICD-10-CM | POA: Diagnosis not present

## 2020-10-21 DIAGNOSIS — Z96652 Presence of left artificial knee joint: Secondary | ICD-10-CM | POA: Diagnosis not present

## 2020-10-24 DIAGNOSIS — M545 Low back pain, unspecified: Secondary | ICD-10-CM | POA: Diagnosis not present

## 2020-10-24 DIAGNOSIS — M6281 Muscle weakness (generalized): Secondary | ICD-10-CM | POA: Diagnosis not present

## 2020-10-24 DIAGNOSIS — R262 Difficulty in walking, not elsewhere classified: Secondary | ICD-10-CM | POA: Diagnosis not present

## 2020-10-26 DIAGNOSIS — M549 Dorsalgia, unspecified: Secondary | ICD-10-CM | POA: Diagnosis not present

## 2020-10-26 DIAGNOSIS — M4316 Spondylolisthesis, lumbar region: Secondary | ICD-10-CM | POA: Diagnosis not present

## 2020-10-26 DIAGNOSIS — M47816 Spondylosis without myelopathy or radiculopathy, lumbar region: Secondary | ICD-10-CM | POA: Diagnosis not present

## 2020-10-26 DIAGNOSIS — M461 Sacroiliitis, not elsewhere classified: Secondary | ICD-10-CM | POA: Diagnosis not present

## 2020-10-26 DIAGNOSIS — M160 Bilateral primary osteoarthritis of hip: Secondary | ICD-10-CM | POA: Diagnosis not present

## 2020-10-28 DIAGNOSIS — D500 Iron deficiency anemia secondary to blood loss (chronic): Secondary | ICD-10-CM | POA: Diagnosis not present

## 2020-11-03 DIAGNOSIS — J441 Chronic obstructive pulmonary disease with (acute) exacerbation: Secondary | ICD-10-CM | POA: Diagnosis not present

## 2020-11-03 DIAGNOSIS — J302 Other seasonal allergic rhinitis: Secondary | ICD-10-CM | POA: Diagnosis not present

## 2020-11-03 DIAGNOSIS — F17200 Nicotine dependence, unspecified, uncomplicated: Secondary | ICD-10-CM | POA: Diagnosis not present

## 2020-11-07 DIAGNOSIS — J4540 Moderate persistent asthma, uncomplicated: Secondary | ICD-10-CM | POA: Diagnosis not present

## 2020-11-18 DIAGNOSIS — M545 Low back pain, unspecified: Secondary | ICD-10-CM | POA: Diagnosis not present

## 2020-11-18 DIAGNOSIS — M4316 Spondylolisthesis, lumbar region: Secondary | ICD-10-CM | POA: Diagnosis not present

## 2020-11-18 DIAGNOSIS — M461 Sacroiliitis, not elsewhere classified: Secondary | ICD-10-CM | POA: Diagnosis not present

## 2020-11-19 DIAGNOSIS — I10 Essential (primary) hypertension: Secondary | ICD-10-CM | POA: Diagnosis not present

## 2020-11-19 DIAGNOSIS — E785 Hyperlipidemia, unspecified: Secondary | ICD-10-CM | POA: Diagnosis not present

## 2020-11-19 DIAGNOSIS — K219 Gastro-esophageal reflux disease without esophagitis: Secondary | ICD-10-CM | POA: Diagnosis not present

## 2020-11-22 DIAGNOSIS — Z96652 Presence of left artificial knee joint: Secondary | ICD-10-CM | POA: Diagnosis not present

## 2020-11-22 DIAGNOSIS — G894 Chronic pain syndrome: Secondary | ICD-10-CM | POA: Diagnosis not present

## 2020-11-22 DIAGNOSIS — M461 Sacroiliitis, not elsewhere classified: Secondary | ICD-10-CM | POA: Diagnosis not present

## 2020-11-22 DIAGNOSIS — M48061 Spinal stenosis, lumbar region without neurogenic claudication: Secondary | ICD-10-CM | POA: Diagnosis not present

## 2020-11-22 DIAGNOSIS — M4316 Spondylolisthesis, lumbar region: Secondary | ICD-10-CM | POA: Diagnosis not present

## 2020-11-22 DIAGNOSIS — G2581 Restless legs syndrome: Secondary | ICD-10-CM | POA: Diagnosis not present

## 2020-11-22 DIAGNOSIS — M5416 Radiculopathy, lumbar region: Secondary | ICD-10-CM | POA: Diagnosis not present

## 2020-11-22 DIAGNOSIS — M7062 Trochanteric bursitis, left hip: Secondary | ICD-10-CM | POA: Diagnosis not present

## 2020-11-22 DIAGNOSIS — Z96651 Presence of right artificial knee joint: Secondary | ICD-10-CM | POA: Diagnosis not present

## 2020-11-24 DIAGNOSIS — M4316 Spondylolisthesis, lumbar region: Secondary | ICD-10-CM | POA: Diagnosis not present

## 2020-11-24 DIAGNOSIS — F17219 Nicotine dependence, cigarettes, with unspecified nicotine-induced disorders: Secondary | ICD-10-CM

## 2020-11-24 DIAGNOSIS — M461 Sacroiliitis, not elsewhere classified: Secondary | ICD-10-CM | POA: Diagnosis not present

## 2020-11-24 HISTORY — DX: Spondylolisthesis, lumbar region: M43.16

## 2020-11-24 HISTORY — DX: Nicotine dependence, cigarettes, with unspecified nicotine-induced disorders: F17.219

## 2020-11-25 ENCOUNTER — Telehealth: Payer: Self-pay

## 2020-11-25 NOTE — Telephone Encounter (Signed)
   [facility] Pre-operative Risk Assessment    Request for surgical clearance:  1. What type of surgery is being performed? Percutaneous SI Joint Stabilization    2. When is this surgery scheduled? TBD   3. What type of clearance is required (medical clearance vs. Pharmacy clearance to hold med vs. Both)? Both  4. Are there any medications that need to be held prior to surgery and how long?Not specified however the patient is on ASA 325   5. Practice name and name of physician performing surgery? Dr.James [facility] at [facility]    6. What is your office phone number: [phone]    7.   What is your office fax number: [phone]  8.   Anesthesia type (None, local, MAC, general) ? General Anesthesia   Basil Dess Leilah Polimeni 11/25/2020, 9:36 AM  _________________________________________________________________   (provider comments below)

## 2020-11-28 ENCOUNTER — Encounter: Payer: Self-pay | Admitting: *Deleted

## 2020-11-28 ENCOUNTER — Encounter: Payer: Self-pay | Admitting: Cardiology

## 2020-11-28 DIAGNOSIS — M1990 Unspecified osteoarthritis, unspecified site: Secondary | ICD-10-CM | POA: Insufficient documentation

## 2020-11-28 DIAGNOSIS — J45909 Unspecified asthma, uncomplicated: Secondary | ICD-10-CM | POA: Insufficient documentation

## 2020-11-28 DIAGNOSIS — G4730 Sleep apnea, unspecified: Secondary | ICD-10-CM | POA: Insufficient documentation

## 2020-11-28 DIAGNOSIS — K219 Gastro-esophageal reflux disease without esophagitis: Secondary | ICD-10-CM | POA: Insufficient documentation

## 2020-11-28 DIAGNOSIS — M797 Fibromyalgia: Secondary | ICD-10-CM | POA: Insufficient documentation

## 2020-11-28 NOTE — Telephone Encounter (Signed)
Patient has appointment for Cardiac Clearance as a new patient with Dr. Bing Matter on 12/30/20

## 2020-11-28 NOTE — Telephone Encounter (Signed)
   Name: Natasha Meyer  DOB: 29-Jun-1956  MRN: [id number]  Primary Cardiologist: None  Chart reviewed as part of pre-operative protocol coverage. Miss Daddona has never been seen by [facility] per my review. As such, she will need to establish as a new patient for cardiac clearance.    I will ask our pre-op covering staff to contact the patient to ensure she does not have another cardiologist she has seen previously at another health institution.   Pre-op covering staff: - Please schedule appointment and call patient to inform them. If patient already had an upcoming appointment within acceptable timeframe, please add "pre-op clearance" to the appointment notes so provider is aware. - Please contact requesting surgeon's office via preferred method (i.e, phone, fax) to inform them of need for appointment prior to surgery.   Alver Sorrow, NP  11/28/2020, 10:55 AM

## 2020-11-28 NOTE — Telephone Encounter (Signed)
    Natasha Meyer DOB:  1956/04/26  MRN:  [id number]   Primary Cardiologist: None  Chart reviewed as part of pre-operative protocol coverage. Miss Natasha Meyer has appointment to establish as new patient and to address cardiac clearance for procedure 12/30/20.   Preoperative clearance will be addressed at that appointment.   I will route this update to the requesting party via Epic fax function and remove from pre-op pool.  Please call with questions.  Alver Sorrow, NP 11/28/2020, 11:31 AM

## 2020-11-29 ENCOUNTER — Encounter: Payer: Self-pay | Admitting: *Deleted

## 2020-11-29 ENCOUNTER — Encounter: Payer: Self-pay | Admitting: Cardiology

## 2020-12-05 DIAGNOSIS — Z711 Person with feared health complaint in whom no diagnosis is made: Secondary | ICD-10-CM | POA: Diagnosis not present

## 2020-12-05 DIAGNOSIS — G4733 Obstructive sleep apnea (adult) (pediatric): Secondary | ICD-10-CM | POA: Diagnosis not present

## 2020-12-07 DIAGNOSIS — J4540 Moderate persistent asthma, uncomplicated: Secondary | ICD-10-CM | POA: Diagnosis not present

## 2020-12-07 DIAGNOSIS — Z23 Encounter for immunization: Secondary | ICD-10-CM | POA: Diagnosis not present

## 2020-12-07 DIAGNOSIS — Z1331 Encounter for screening for depression: Secondary | ICD-10-CM | POA: Diagnosis not present

## 2020-12-07 DIAGNOSIS — I10 Essential (primary) hypertension: Secondary | ICD-10-CM | POA: Diagnosis not present

## 2020-12-07 DIAGNOSIS — G4733 Obstructive sleep apnea (adult) (pediatric): Secondary | ICD-10-CM | POA: Diagnosis not present

## 2020-12-07 DIAGNOSIS — Z6829 Body mass index (BMI) 29.0-29.9, adult: Secondary | ICD-10-CM | POA: Diagnosis not present

## 2020-12-07 DIAGNOSIS — Z0000 Encounter for general adult medical examination without abnormal findings: Secondary | ICD-10-CM | POA: Diagnosis not present

## 2020-12-07 DIAGNOSIS — E119 Type 2 diabetes mellitus without complications: Secondary | ICD-10-CM | POA: Diagnosis not present

## 2020-12-19 DIAGNOSIS — E785 Hyperlipidemia, unspecified: Secondary | ICD-10-CM | POA: Diagnosis not present

## 2020-12-19 DIAGNOSIS — K219 Gastro-esophageal reflux disease without esophagitis: Secondary | ICD-10-CM | POA: Diagnosis not present

## 2020-12-19 DIAGNOSIS — I10 Essential (primary) hypertension: Secondary | ICD-10-CM | POA: Diagnosis not present

## 2020-12-26 DIAGNOSIS — G894 Chronic pain syndrome: Secondary | ICD-10-CM | POA: Diagnosis not present

## 2020-12-26 DIAGNOSIS — M4316 Spondylolisthesis, lumbar region: Secondary | ICD-10-CM | POA: Diagnosis not present

## 2020-12-26 DIAGNOSIS — J301 Allergic rhinitis due to pollen: Secondary | ICD-10-CM | POA: Diagnosis not present

## 2020-12-26 DIAGNOSIS — F17210 Nicotine dependence, cigarettes, uncomplicated: Secondary | ICD-10-CM | POA: Diagnosis not present

## 2020-12-26 DIAGNOSIS — M5416 Radiculopathy, lumbar region: Secondary | ICD-10-CM | POA: Diagnosis not present

## 2020-12-26 DIAGNOSIS — M7062 Trochanteric bursitis, left hip: Secondary | ICD-10-CM | POA: Diagnosis not present

## 2020-12-26 DIAGNOSIS — G4730 Sleep apnea, unspecified: Secondary | ICD-10-CM | POA: Diagnosis not present

## 2020-12-26 DIAGNOSIS — Z96652 Presence of left artificial knee joint: Secondary | ICD-10-CM | POA: Diagnosis not present

## 2020-12-26 DIAGNOSIS — Z96651 Presence of right artificial knee joint: Secondary | ICD-10-CM | POA: Diagnosis not present

## 2020-12-26 DIAGNOSIS — M461 Sacroiliitis, not elsewhere classified: Secondary | ICD-10-CM | POA: Diagnosis not present

## 2020-12-26 DIAGNOSIS — M48061 Spinal stenosis, lumbar region without neurogenic claudication: Secondary | ICD-10-CM | POA: Diagnosis not present

## 2020-12-26 DIAGNOSIS — J4540 Moderate persistent asthma, uncomplicated: Secondary | ICD-10-CM | POA: Diagnosis not present

## 2020-12-26 DIAGNOSIS — G2581 Restless legs syndrome: Secondary | ICD-10-CM | POA: Diagnosis not present

## 2020-12-30 ENCOUNTER — Ambulatory Visit: Payer: Medicare HMO | Admitting: Cardiology

## 2020-12-30 ENCOUNTER — Other Ambulatory Visit: Payer: Self-pay

## 2020-12-30 ENCOUNTER — Encounter: Payer: Self-pay | Admitting: Cardiology

## 2020-12-30 VITALS — BP 130/82 | HR 101 | Ht <= 58 in | Wt 138.0 lb

## 2020-12-30 DIAGNOSIS — Z01810 Encounter for preprocedural cardiovascular examination: Secondary | ICD-10-CM

## 2020-12-30 DIAGNOSIS — J410 Simple chronic bronchitis: Secondary | ICD-10-CM | POA: Diagnosis not present

## 2020-12-30 DIAGNOSIS — F17219 Nicotine dependence, cigarettes, with unspecified nicotine-induced disorders: Secondary | ICD-10-CM | POA: Diagnosis not present

## 2020-12-30 DIAGNOSIS — E119 Type 2 diabetes mellitus without complications: Secondary | ICD-10-CM | POA: Diagnosis not present

## 2020-12-30 DIAGNOSIS — I10 Essential (primary) hypertension: Secondary | ICD-10-CM | POA: Insufficient documentation

## 2020-12-30 DIAGNOSIS — J449 Chronic obstructive pulmonary disease, unspecified: Secondary | ICD-10-CM

## 2020-12-30 DIAGNOSIS — E785 Hyperlipidemia, unspecified: Secondary | ICD-10-CM

## 2020-12-30 HISTORY — DX: Encounter for preprocedural cardiovascular examination: Z01.810

## 2020-12-30 HISTORY — DX: Chronic obstructive pulmonary disease, unspecified: J44.9

## 2020-12-30 NOTE — Addendum Note (Signed)
Addended by: Hazle Quant on: 12/30/2020 02:26 PM   Modules accepted: Orders

## 2020-12-30 NOTE — Patient Instructions (Signed)
Medication Instructions:  Your physician recommends that you continue on your current medications as directed. Please refer to the Current Medication list given to you today.  *If you need a refill on your cardiac medications before your next appointment, please call your pharmacy*   Lab Work: None If you have labs (blood work) drawn today and your tests are completely normal, you will receive your results only by: MyChart Message (if you have MyChart) OR A paper copy in the mail If you have any lab test that is abnormal or we need to change your treatment, we will call you to review the results.   Testing/Procedures: Your physician has requested that you have an echocardiogram. Echocardiography is a painless test that uses sound waves to create images of your heart. It provides your doctor with information about the size and shape of your heart and how well your heart's chambers and valves are working. This procedure takes approximately one hour. There are no restrictions for this procedure.    [facility] [address] [place], [place] [address] Phone:  [phone]    Please arrive 15 minutes prior to your appointment time for registration and insurance purposes.  The test will take approximately 3 to 4 hours to complete; you may bring reading material.  If someone comes with you to your appointment, they will need to remain in the main lobby due to limited space in the testing area. **If you are pregnant or breastfeeding, please notify the nuclear lab prior to your appointment**  How to prepare for your Myocardial Perfusion Test: Do not eat or drink 3 hours prior to your test, except you may have water. Do not consume products containing caffeine (regular or decaffeinated) 12 hours prior to your test. (ex: coffee, chocolate, sodas, tea). Do bring a list of your current medications with you.  If not listed below, you may take your medications as  normal.  Do wear comfortable clothes (no dresses or overalls) and walking shoes, tennis shoes preferred (No heels or open toe shoes are allowed). Do NOT wear cologne, perfume, aftershave, or lotions (deodorant is allowed). If these instructions are not followed, your test will have to be rescheduled.  Please report to [address] for your test.  If you have questions or concerns about your appointment, you can call the [facility] at [phone].  If you cannot keep your appointment, please provide 24 hours notification to the Nuclear Lab, to avoid a possible $50 charge to your account.    Follow-Up: At [facility], you and your health needs are our priority.  As part of our continuing mission to provide you with exceptional heart care, we have created designated Provider Care Teams.  These Care Teams include your primary Cardiologist (physician) and Advanced Practice Providers (APPs -  Physician Assistants and Nurse Practitioners) who all work together to provide you with the care you need, when you need it.  We recommend signing up for the patient portal called "MyChart".  Sign up information is provided on this After Visit Summary.  MyChart is used to connect with patients for Virtual Visits (Telemedicine).  Patients are able to view lab/test results, encounter notes, upcoming appointments, etc.  Non-urgent messages can be sent to your provider as well.   To learn more about what you can do with MyChart, go to [email or web address].    Your next appointment:   3 month(s)  The format for your next appointment:  In Person  Provider:   Gypsy Balsam, MD   Other Instructions  Echocardiogram An echocardiogram is a test that uses sound waves (ultrasound) to produce images of the heart. Images from an echocardiogram can provide important information about: Heart size and shape. The size and thickness and movement of your heart's  walls. Heart muscle function and strength. Heart valve function or if you have stenosis. Stenosis is when the heart valves are too narrow. If blood is flowing backward through the heart valves (regurgitation). A tumor or infectious growth around the heart valves. Areas of heart muscle that are not working well because of poor blood flow or injury from a heart attack. Aneurysm detection. An aneurysm is a weak or damaged part of an artery wall. The wall bulges out from the normal force of blood pumping through the body. Tell a health care provider about: Any allergies you have. All medicines you are taking, including vitamins, herbs, eye drops, creams, and over-the-counter medicines. Any blood disorders you have. Any surgeries you have had. Any medical conditions you have. Whether you are pregnant or may be pregnant. What are the risks? Generally, this is a safe test. However, problems may occur, including an allergic reaction to dye (contrast) that may be used during the test. What happens before the test? No specific preparation is needed. You may eat and drink normally. What happens during the test?  You will take off your clothes from the waist up and put on a hospital gown. Electrodes or electrocardiogram (ECG)patches may be placed on your chest. The electrodes or patches are then connected to a device that monitors your heart rate and rhythm. You will lie down on a table for an ultrasound exam. A gel will be applied to your chest to help sound waves pass through your skin. A handheld device, called a transducer, will be pressed against your chest and moved over your heart. The transducer produces sound waves that travel to your heart and bounce back (or "echo" back) to the transducer. These sound waves will be captured in real-time and changed into images of your heart that can be viewed on a video monitor. The images will be recorded on a computer and reviewed by your health care  provider. You may be asked to change positions or hold your breath for a short time. This makes it easier to get different views or better views of your heart. In some cases, you may receive contrast through an IV in one of your veins. This can improve the quality of the pictures from your heart. The procedure may vary among health care providers and hospitals. What can I expect after the test? You may return to your normal, everyday life, including diet, activities, andmedicines, unless your health care provider tells you not to do that. Follow these instructions at home: It is up to you to get the results of your test. Ask your health care provider, or the department that is doing the test, when your results will be ready. Keep all follow-up visits. This is important. Summary An echocardiogram is a test that uses sound waves (ultrasound) to produce images of the heart. Images from an echocardiogram can provide important information about the size and shape of your heart, heart muscle function, heart valve function, and other possible heart problems. You do not need to do anything to prepare before this test. You may eat and drink normally. After the echocardiogram is completed, you may return to your normal, everyday life, unless  your health care provider tells you not to do that. This information is not intended to replace advice given to you by your health care provider. Make sure you discuss any questions you have with your healthcare provider. Document Revised: 03/01/2020 Document Reviewed: 03/01/2020 Elsevier Patient Education  2022 [employer].

## 2020-12-30 NOTE — Progress Notes (Signed)
Cardiology Consultation:    Date:  12/30/2020   ID:  Natasha Meyer, DOB [birth year], MRN [id number]  PCP:  Marylen Ponto, MD  Cardiologist:  Gypsy Balsam, MD   Referring MD: Arlie Solomons, MD   Chief Complaint  Patient presents with   Clearance SI Joint Stabilization    TBD    History of Present Illness:    Natasha Meyer is a 65 y.o. female who is being seen today for the evaluation of cardiovascular preop evaluation at the request of Arlie Solomons, MD. her past medical history significant for essential hypertension, diabetes which appears to be diet controlled, dyslipidemia with recently initiated statin, she is a chronic smoker still continues to smoke does have COPD which is advanced, she is oxygen all the time. She was referred to [place] for cardiovascular evaluation before as I joint surgery.  She denies have any chest pain tightness squeezing pressure mid chest chief complaint shortness of breath.  Her ability to exercise is very limited because of this.  She cannot go from front to the back of [employer] because of shortness of breath.  She denies have any palpitations, there is no swelling of lower extremities.  She described to have some episode of dizziness that happen when she walks and that happens only when she does not use oxygen.  When she was her oxygen on the regular basis she does not have any dizziness. She never had any heart trouble She does have family members with coronary artery disease but not premature.  In the medevac I am taking care of her family members She has been smoking all her life still continues to smoke.  Past Medical History:  Diagnosis Date   Aches 11/30/2010   Formatting of this note might be different from the original. Widespread   Anemia    Arthritis    Asthma    Chronic hepatitis C virus infection (HCC) 05/07/2013   Cigarette nicotine dependence with nicotine-induced disorder 11/24/2020   Last Assessment & Plan:  Formatting of this note  might be different from the original. I spent a total of 4 minutes with the patient counseling them on smoking cessation efforts.  She is aware of the medical complications of smoking including adverse events of lung cancer, emphysema, poor wound healing etc.  She has attempted to quit in the past.Barriers to quitting and avoidance of causes of rela   Depression    Diabetes mellitus type II, controlled (HCC) 01/30/2018   Fibromyalgia    GERD (gastroesophageal reflux disease)    Hepatitis    history of "C" ,took treatments no longer have it   Hyperlipidemia    Hypertension    Pain in joint, lower leg 11/30/2010   Plantar wart, left foot 01/30/2018   Pneumonia    history   Primary osteoarthritis of left knee 05/27/2015   S/P total knee replacement 12/24/2016   Sacroiliitis, not elsewhere classified (HCC) 09/01/2020   Last Assessment & Plan:  Formatting of this note might be different from the original. She does describe some nondescript SI joint pain worse on the left.  Some of her symptoms are atypical in her pain is very chronic.  She reportedly has received temporary and repeated relief with multiple SI joint injections in the past.  The SI joint injections been more useful to her the lumbar epidural steroi   Sleep apnea    Spondylolisthesis of lumbar region 11/24/2020   Last Assessment & Plan:  Formatting  of this note might be different from the original. I personally reviewed the MRI of the lumbar spine as well as flexion and extension lumbar x-rays.  This demonstrates a stable degenerative spondylolisthesis at L4-5.  I do not see overt nerve root compression.  Her pain is isolated to the sacroiliac region and does not radiate and therefore I would not recommend   Thumb pain, left 07/05/2016    Past Surgical History:  Procedure Laterality Date   ABDOMINAL HYSTERECTOMY     CARPAL TUNNEL RELEASE Bilateral 1990's   CHOLECYSTECTOMY     HAND TENDON SURGERY Left    graft   JOINT REPLACEMENT Right  2009   knee   TOTAL KNEE ARTHROPLASTY Left 12/24/2016   Procedure: TOTAL KNEE ARTHROPLASTY;  Surgeon: Dannielle Huh, MD;  Location: [facility] OR;  Service: Orthopedics;  Laterality: Left;    Current Medications: Current Meds  Medication Sig   albuterol (VENTOLIN HFA) 108 (90 Base) MCG/ACT inhaler Inhale 2 puffs into the lungs every 6 (six) hours as needed for wheezing or shortness of breath.   amLODipine-valsartan (EXFORGE) 5-160 MG tablet Take 1 tablet by mouth daily.   budesonide-formoterol (SYMBICORT) 160-4.5 MCG/ACT inhaler Inhale 2 puffs into the lungs 2 (two) times daily.   busPIRone (BUSPAR) 10 MG tablet Take 7.5 mg by mouth daily.   calcium-vitamin D 250-100 MG-UNIT tablet Take 1 tablet by mouth daily. 500 units   cetirizine (ZYRTEC) 10 MG tablet Take 10 mg by mouth daily.   Cholecalciferol (VITAMIN D3) 5000 units CAPS Take 5,000 Units by mouth daily.   dicyclomine (BENTYL) 10 MG capsule Take 10 mg by mouth daily.   DULoxetine (CYMBALTA) 60 MG capsule Take 60 mg by mouth daily.   estradiol (ESTRACE) 1 MG tablet Take 1 mg by mouth daily.   ferrous sulfate 325 (65 FE) MG EC tablet Take 325 mg by mouth daily with breakfast.   fluticasone (FLONASE) 50 MCG/ACT nasal spray Place 2 sprays into both nostrils daily.   meloxicam (MOBIC) 7.5 MG tablet Take 7.5 mg by mouth in the morning and at bedtime.   nortriptyline (PAMELOR) 25 MG capsule Take 25 mg by mouth at bedtime.   Oxycodone HCl 10 MG TABS Take 10 mg by mouth every 6 (six) hours as needed (moderate pain).   pantoprazole (PROTONIX) 40 MG tablet Take 40 mg by mouth 2 (two) times daily.   pregabalin (LYRICA) 150 MG capsule Take 150 mg by mouth every 8 (eight) hours.   promethazine (PHENERGAN) 25 MG tablet Take 25 mg by mouth every 12 (twelve) hours as needed for nausea or vomiting.   rosuvastatin (CRESTOR) 20 MG tablet Take 20 mg by mouth daily.   tiotropium (SPIRIVA HANDIHALER) 18 MCG inhalation capsule Place 1 capsule into inhaler and inhale  daily.   vitamin E 400 UNIT capsule Take 400 Units by mouth daily.     Allergies:   Hylan g-f 20, Glucosamine-chondroitin, Prednisone, Codeine, Lisinopril, Sulfa antibiotics, Albuterol, Latex, and Morphine and related   Social History   Socioeconomic History   Marital status: Single    Spouse name: Not on file   Number of children: Not on file   Years of education: Not on file   Highest education level: Not on file  Occupational History   Not on file  Tobacco Use   Smoking status: Every Day    Packs/day: 0.25    Years: 40.00    Pack years: 10.00    Types: Cigarettes   Smokeless tobacco: Never  Substance and Sexual Activity   Alcohol use: No   Drug use: No   Sexual activity: Not on file  Other Topics Concern   Not on file  Social History Narrative   Not on file   Social Determinants of Health   Financial Resource Strain: Not on file  Food Insecurity: Not on file  Transportation Needs: Not on file  Physical Activity: Not on file  Stress: Not on file  Social Connections: Not on file     Family History: The patient's family history includes Heart disease in her maternal grandfather, maternal grandmother, and mother; Heart failure in her father. ROS:   Please see the history of present illness.    All 14 point review of systems negative except as described per history of present illness.  EKGs/Labs/Other Studies Reviewed:    The following studies were reviewed today:   EKG:  EKG is  ordered today.  The ekg ordered today demonstrates normal sinus rhythm, normal P interval, normal QS complex duration morphology no ST segment changes  Recent Labs: No results found for requested labs within last 8760 hours.  Recent Lipid Panel No results found for: CHOL, TRIG, HDL, CHOLHDL, VLDL, LDLCALC, LDLDIRECT  Physical Exam:    VS:  BP 130/82 (BP Location: Left Arm, Patient Position: Sitting)   Pulse (!) 101   Ht 4\' 10"  (1.473 m)   Wt 138 lb (62.6 kg)   SpO2 98%   BMI  28.84 kg/m     Wt Readings from Last 3 Encounters:  12/30/20 138 lb (62.6 kg)  11/24/20 136 lb (61.7 kg)  12/13/16 141 lb 6.4 oz (64.1 kg)     GEN:  Well nourished, well developed in no acute distress HEENT: Normal NECK: No JVD; No carotid bruits LYMPHATICS: No lymphadenopathy CARDIAC: RRR, no murmurs, no rubs, no gallops RESPIRATORY: Poor air entry bilaterally ABDOMEN: Soft, non-tender, non-distended MUSCULOSKELETAL:  No edema; No deformity  SKIN: Warm and dry NEUROLOGIC:  Alert and oriented x 3 PSYCHIATRIC:  Normal affect   ASSESSMENT:    1. Controlled type 2 diabetes mellitus without complication, without long-term current use of insulin (HCC)   2. Preop cardiovascular exam   3. Essential hypertension   4. Dyslipidemia   5. Cigarette nicotine dependence with nicotine-induced disorder   6. Simple chronic bronchitis (HCC)    PLAN:    In order of problems listed above:  Cardiovascular preop evaluation for this lady with multiple risk factors of coronary disease with very poor exercise tolerance secondary to advanced COPD.  Plan will be to do echocardiogram to assess left ventricle ejection fraction.  We also need to evaluate her for coronary artery disease because of multiple risk factors for it.  Therefore, I will schedule her to have dobutamine Cardiolite.  In the meantime I will not change any of her medications.  Of course the key will be also risk factors modifications. Type 2 diabetes, she tells me is only diet controlled at this visit her K PN which show me her hemoglobin A1c of 5.9 from 07 Dec 2020.  It is Meyer control. Dyslipidemia just recently she started on cholesterol-lowering medications she tells me however that she does not remember the name and its not listed on our list of medication.  We will try to investigate this issue as Smoking obviously huge problem she understand that this is an issue and she is trying to quit I encouraged her to do it and told her this  is probably one  of the best thing she can do for herself both from the lungs as well as from heart point of view. Overall I consider her at least intermediate risk for surgery and from both pulmonary as well as heart issues.  Of course we will evaluate her heart to be give a better estimation of her risk predictors.   Medication Adjustments/Labs and Tests Ordered: Current medicines are reviewed at length with the patient today.  Concerns regarding medicines are outlined above.  No orders of the defined types were placed in this encounter.  No orders of the defined types were placed in this encounter.   Signed, Georgeanna Leaobert J. Yussef Jorge, MD, [facility] HospitalFACC. 12/30/2020 2:16 PM    [facility]

## 2021-01-03 ENCOUNTER — Telehealth (HOSPITAL_COMMUNITY): Payer: Self-pay | Admitting: *Deleted

## 2021-01-03 DIAGNOSIS — D500 Iron deficiency anemia secondary to blood loss (chronic): Secondary | ICD-10-CM | POA: Diagnosis not present

## 2021-01-03 DIAGNOSIS — R195 Other fecal abnormalities: Secondary | ICD-10-CM | POA: Diagnosis not present

## 2021-01-03 DIAGNOSIS — K581 Irritable bowel syndrome with constipation: Secondary | ICD-10-CM | POA: Diagnosis not present

## 2021-01-03 DIAGNOSIS — K219 Gastro-esophageal reflux disease without esophagitis: Secondary | ICD-10-CM | POA: Diagnosis not present

## 2021-01-03 NOTE — Telephone Encounter (Signed)
Patient given detailed instructions per Myocardial Perfusion Study Information Sheet for the test on 01/04/2021 at 11:15. Patient notified to arrive 15 minutes early and that it is imperative to arrive on time for appointment to keep from having the test rescheduled.  If you need to cancel or reschedule your appointment, please call the office within 24 hours of your appointment. . Patient verbalized understanding.Natasha Meyer

## 2021-01-04 ENCOUNTER — Other Ambulatory Visit: Payer: Self-pay

## 2021-01-04 ENCOUNTER — Ambulatory Visit (INDEPENDENT_AMBULATORY_CARE_PROVIDER_SITE_OTHER): Payer: Medicare HMO

## 2021-01-04 DIAGNOSIS — J410 Simple chronic bronchitis: Secondary | ICD-10-CM

## 2021-01-04 DIAGNOSIS — Z01810 Encounter for preprocedural cardiovascular examination: Secondary | ICD-10-CM | POA: Diagnosis not present

## 2021-01-04 DIAGNOSIS — E785 Hyperlipidemia, unspecified: Secondary | ICD-10-CM

## 2021-01-04 DIAGNOSIS — F17219 Nicotine dependence, cigarettes, with unspecified nicotine-induced disorders: Secondary | ICD-10-CM

## 2021-01-04 DIAGNOSIS — E119 Type 2 diabetes mellitus without complications: Secondary | ICD-10-CM

## 2021-01-04 DIAGNOSIS — I10 Essential (primary) hypertension: Secondary | ICD-10-CM

## 2021-01-04 LAB — MYOCARDIAL PERFUSION IMAGING
LV dias vol: 63 mL (ref 46–106)
LV sys vol: 29 mL
Peak HR: 113 {beats}/min
Rest HR: 92 {beats}/min
SDS: 1
SRS: 1
SSS: 2
TID: 1.06

## 2021-01-04 MED ORDER — REGADENOSON 0.4 MG/5ML IV SOLN
0.4000 mg | Freq: Once | INTRAVENOUS | Status: AC
  Administered 2021-01-04: 0.4 mg via INTRAVENOUS

## 2021-01-04 MED ORDER — AMINOPHYLLINE 25 MG/ML IV SOLN
75.0000 mg | Freq: Once | INTRAVENOUS | Status: AC
  Administered 2021-01-04: 75 mg via INTRAVENOUS

## 2021-01-04 MED ORDER — TECHNETIUM TC 99M TETROFOSMIN IV KIT
10.8000 | PACK | Freq: Once | INTRAVENOUS | Status: AC | PRN
  Administered 2021-01-04: 10.8 via INTRAVENOUS

## 2021-01-04 MED ORDER — TECHNETIUM TC 99M TETROFOSMIN IV KIT
32.2000 | PACK | Freq: Once | INTRAVENOUS | Status: AC | PRN
  Administered 2021-01-04: 32.2 via INTRAVENOUS

## 2021-01-05 ENCOUNTER — Telehealth: Payer: Self-pay | Admitting: Emergency Medicine

## 2021-01-05 NOTE — Telephone Encounter (Signed)
While patient was at nuclear appointment she asked me to update her medications. She states she is not taking spirava and wanted me to take this off. I have updated this in her chart.

## 2021-01-07 DIAGNOSIS — J4540 Moderate persistent asthma, uncomplicated: Secondary | ICD-10-CM | POA: Diagnosis not present

## 2021-01-19 ENCOUNTER — Ambulatory Visit (INDEPENDENT_AMBULATORY_CARE_PROVIDER_SITE_OTHER): Payer: Medicare HMO

## 2021-01-19 ENCOUNTER — Other Ambulatory Visit: Payer: Self-pay

## 2021-01-19 DIAGNOSIS — I10 Essential (primary) hypertension: Secondary | ICD-10-CM | POA: Diagnosis not present

## 2021-01-19 DIAGNOSIS — J410 Simple chronic bronchitis: Secondary | ICD-10-CM

## 2021-01-19 DIAGNOSIS — E785 Hyperlipidemia, unspecified: Secondary | ICD-10-CM | POA: Diagnosis not present

## 2021-01-19 DIAGNOSIS — E119 Type 2 diabetes mellitus without complications: Secondary | ICD-10-CM | POA: Diagnosis not present

## 2021-01-19 DIAGNOSIS — Z01810 Encounter for preprocedural cardiovascular examination: Secondary | ICD-10-CM

## 2021-01-19 DIAGNOSIS — F17219 Nicotine dependence, cigarettes, with unspecified nicotine-induced disorders: Secondary | ICD-10-CM

## 2021-01-19 DIAGNOSIS — K219 Gastro-esophageal reflux disease without esophagitis: Secondary | ICD-10-CM | POA: Diagnosis not present

## 2021-01-19 LAB — ECHOCARDIOGRAM COMPLETE
Area-P 1/2: 4.49 cm2
Calc EF: 47.5 %
S' Lateral: 2.5 cm
Single Plane A2C EF: 41.6 %
Single Plane A4C EF: 51.8 %

## 2021-01-19 NOTE — Progress Notes (Signed)
Complete echocardiogram performed.  Jimmy Pennelope Basque RDCS, RVT  

## 2021-01-24 DIAGNOSIS — M461 Sacroiliitis, not elsewhere classified: Secondary | ICD-10-CM | POA: Diagnosis not present

## 2021-01-24 DIAGNOSIS — G2581 Restless legs syndrome: Secondary | ICD-10-CM | POA: Diagnosis not present

## 2021-01-24 DIAGNOSIS — M7062 Trochanteric bursitis, left hip: Secondary | ICD-10-CM | POA: Diagnosis not present

## 2021-01-24 DIAGNOSIS — Z96652 Presence of left artificial knee joint: Secondary | ICD-10-CM | POA: Diagnosis not present

## 2021-01-24 DIAGNOSIS — G894 Chronic pain syndrome: Secondary | ICD-10-CM | POA: Diagnosis not present

## 2021-01-24 DIAGNOSIS — M48061 Spinal stenosis, lumbar region without neurogenic claudication: Secondary | ICD-10-CM | POA: Diagnosis not present

## 2021-01-24 DIAGNOSIS — M5416 Radiculopathy, lumbar region: Secondary | ICD-10-CM | POA: Diagnosis not present

## 2021-01-24 DIAGNOSIS — M4316 Spondylolisthesis, lumbar region: Secondary | ICD-10-CM | POA: Diagnosis not present

## 2021-01-24 DIAGNOSIS — Z96651 Presence of right artificial knee joint: Secondary | ICD-10-CM | POA: Diagnosis not present

## 2021-02-03 DIAGNOSIS — J301 Allergic rhinitis due to pollen: Secondary | ICD-10-CM | POA: Diagnosis not present

## 2021-02-03 DIAGNOSIS — F17210 Nicotine dependence, cigarettes, uncomplicated: Secondary | ICD-10-CM | POA: Diagnosis not present

## 2021-02-03 DIAGNOSIS — J4540 Moderate persistent asthma, uncomplicated: Secondary | ICD-10-CM | POA: Diagnosis not present

## 2021-02-03 DIAGNOSIS — G4730 Sleep apnea, unspecified: Secondary | ICD-10-CM | POA: Diagnosis not present

## 2021-02-06 DIAGNOSIS — J4540 Moderate persistent asthma, uncomplicated: Secondary | ICD-10-CM | POA: Diagnosis not present

## 2021-02-13 DIAGNOSIS — Z87891 Personal history of nicotine dependence: Secondary | ICD-10-CM | POA: Diagnosis not present

## 2021-02-13 DIAGNOSIS — F17210 Nicotine dependence, cigarettes, uncomplicated: Secondary | ICD-10-CM | POA: Diagnosis not present

## 2021-02-13 DIAGNOSIS — Z122 Encounter for screening for malignant neoplasm of respiratory organs: Secondary | ICD-10-CM | POA: Diagnosis not present

## 2021-02-19 DIAGNOSIS — K219 Gastro-esophageal reflux disease without esophagitis: Secondary | ICD-10-CM | POA: Diagnosis not present

## 2021-02-19 DIAGNOSIS — E785 Hyperlipidemia, unspecified: Secondary | ICD-10-CM | POA: Diagnosis not present

## 2021-02-19 DIAGNOSIS — I10 Essential (primary) hypertension: Secondary | ICD-10-CM | POA: Diagnosis not present

## 2021-02-24 DIAGNOSIS — M7062 Trochanteric bursitis, left hip: Secondary | ICD-10-CM | POA: Diagnosis not present

## 2021-02-24 DIAGNOSIS — M48061 Spinal stenosis, lumbar region without neurogenic claudication: Secondary | ICD-10-CM | POA: Diagnosis not present

## 2021-02-24 DIAGNOSIS — M5416 Radiculopathy, lumbar region: Secondary | ICD-10-CM | POA: Diagnosis not present

## 2021-02-24 DIAGNOSIS — G894 Chronic pain syndrome: Secondary | ICD-10-CM | POA: Diagnosis not present

## 2021-02-24 DIAGNOSIS — M4316 Spondylolisthesis, lumbar region: Secondary | ICD-10-CM | POA: Diagnosis not present

## 2021-02-24 DIAGNOSIS — Z96651 Presence of right artificial knee joint: Secondary | ICD-10-CM | POA: Diagnosis not present

## 2021-02-24 DIAGNOSIS — G2581 Restless legs syndrome: Secondary | ICD-10-CM | POA: Diagnosis not present

## 2021-02-24 DIAGNOSIS — M461 Sacroiliitis, not elsewhere classified: Secondary | ICD-10-CM | POA: Diagnosis not present

## 2021-02-24 DIAGNOSIS — Z96652 Presence of left artificial knee joint: Secondary | ICD-10-CM | POA: Diagnosis not present

## 2021-03-06 DIAGNOSIS — S61211A Laceration without foreign body of left index finger without damage to nail, initial encounter: Secondary | ICD-10-CM | POA: Diagnosis not present

## 2021-03-09 DIAGNOSIS — J4540 Moderate persistent asthma, uncomplicated: Secondary | ICD-10-CM | POA: Diagnosis not present

## 2021-03-15 DIAGNOSIS — J301 Allergic rhinitis due to pollen: Secondary | ICD-10-CM | POA: Diagnosis not present

## 2021-03-15 DIAGNOSIS — J4540 Moderate persistent asthma, uncomplicated: Secondary | ICD-10-CM | POA: Diagnosis not present

## 2021-03-15 DIAGNOSIS — F17210 Nicotine dependence, cigarettes, uncomplicated: Secondary | ICD-10-CM | POA: Diagnosis not present

## 2021-03-22 DIAGNOSIS — I10 Essential (primary) hypertension: Secondary | ICD-10-CM | POA: Diagnosis not present

## 2021-03-22 DIAGNOSIS — E785 Hyperlipidemia, unspecified: Secondary | ICD-10-CM | POA: Diagnosis not present

## 2021-03-22 DIAGNOSIS — K219 Gastro-esophageal reflux disease without esophagitis: Secondary | ICD-10-CM | POA: Diagnosis not present

## 2021-03-24 DIAGNOSIS — M48061 Spinal stenosis, lumbar region without neurogenic claudication: Secondary | ICD-10-CM | POA: Diagnosis not present

## 2021-03-24 DIAGNOSIS — G2581 Restless legs syndrome: Secondary | ICD-10-CM | POA: Diagnosis not present

## 2021-03-24 DIAGNOSIS — M5416 Radiculopathy, lumbar region: Secondary | ICD-10-CM | POA: Diagnosis not present

## 2021-03-24 DIAGNOSIS — M7062 Trochanteric bursitis, left hip: Secondary | ICD-10-CM | POA: Diagnosis not present

## 2021-03-24 DIAGNOSIS — M461 Sacroiliitis, not elsewhere classified: Secondary | ICD-10-CM | POA: Diagnosis not present

## 2021-03-24 DIAGNOSIS — Z96652 Presence of left artificial knee joint: Secondary | ICD-10-CM | POA: Diagnosis not present

## 2021-03-24 DIAGNOSIS — M4316 Spondylolisthesis, lumbar region: Secondary | ICD-10-CM | POA: Diagnosis not present

## 2021-03-24 DIAGNOSIS — G894 Chronic pain syndrome: Secondary | ICD-10-CM | POA: Diagnosis not present

## 2021-03-24 DIAGNOSIS — Z96651 Presence of right artificial knee joint: Secondary | ICD-10-CM | POA: Diagnosis not present

## 2021-04-05 ENCOUNTER — Other Ambulatory Visit: Payer: Self-pay

## 2021-04-05 DIAGNOSIS — F32A Depression, unspecified: Secondary | ICD-10-CM | POA: Insufficient documentation

## 2021-04-05 DIAGNOSIS — J189 Pneumonia, unspecified organism: Secondary | ICD-10-CM | POA: Insufficient documentation

## 2021-04-05 DIAGNOSIS — K759 Inflammatory liver disease, unspecified: Secondary | ICD-10-CM | POA: Insufficient documentation

## 2021-04-05 DIAGNOSIS — D649 Anemia, unspecified: Secondary | ICD-10-CM | POA: Insufficient documentation

## 2021-04-06 ENCOUNTER — Other Ambulatory Visit: Payer: Self-pay

## 2021-04-06 ENCOUNTER — Encounter: Payer: Self-pay | Admitting: Cardiology

## 2021-04-06 ENCOUNTER — Ambulatory Visit: Payer: Medicare HMO | Admitting: Cardiology

## 2021-04-06 VITALS — BP 160/86 | HR 98 | Ht <= 58 in | Wt 141.2 lb

## 2021-04-06 DIAGNOSIS — J410 Simple chronic bronchitis: Secondary | ICD-10-CM | POA: Diagnosis not present

## 2021-04-06 DIAGNOSIS — F17219 Nicotine dependence, cigarettes, with unspecified nicotine-induced disorders: Secondary | ICD-10-CM

## 2021-04-06 DIAGNOSIS — I10 Essential (primary) hypertension: Secondary | ICD-10-CM | POA: Diagnosis not present

## 2021-04-06 DIAGNOSIS — E785 Hyperlipidemia, unspecified: Secondary | ICD-10-CM | POA: Diagnosis not present

## 2021-04-06 DIAGNOSIS — Z01810 Encounter for preprocedural cardiovascular examination: Secondary | ICD-10-CM | POA: Diagnosis not present

## 2021-04-06 NOTE — Progress Notes (Signed)
Cardiology Office Note:    Date:  04/06/2021   ID:  Natasha Meyer, DOB 12-Jun-1956, MRN [id number]  PCP:  Marylen Ponto, MD  Cardiologist:  Gypsy Balsam, MD    Referring MD: Marylen Ponto, MD   Chief Complaint  Patient presents with   Follow-up  Testing  History of Present Illness:    Natasha Meyer is a 65 y.o. female with past medical history significant for essential hypertension, diabetes which appears to be diet controlled, dyslipidemia, chronic smoker she was referred to [place] originally for evaluation before potential orthopedic surgery.  Evaluation included echocardiogram which showed lower limits of normal ejection fraction as well as stress test which showed no evidence of ischemia she comes here to my office to talk about that.  Overall she is doing fair.  She is a chronic smoker for struggling because of shortness of breath she was also told that she must quit smoking for about 3 months before surgery will be contemplated she was just struggling trying to quit she used to smoke 1-1/2 pack/day now is about a three-quarter of a pack.  Denies have any chest pain tightness squeezing pressure burning chest.  Reports shortness of breath started.  She is having high blood pressure today but she said it is related to her back problem.  Past Medical History:  Diagnosis Date   Aches 11/30/2010   Formatting of this note might be different from the original. Widespread   Anemia    Arthritis    Asthma    Chronic hepatitis C virus infection (HCC) 05/07/2013   Cigarette nicotine dependence with nicotine-induced disorder 11/24/2020   Last Assessment & Plan:  Formatting of this note might be different from the original. I spent a total of 4 minutes with the patient counseling them on smoking cessation efforts.  She is aware of the medical complications of smoking including adverse events of lung cancer, emphysema, poor wound healing etc.  She has attempted to quit in the past.Barriers to quitting  and avoidance of causes of rela   Depression    Diabetes mellitus type II, controlled (HCC) 01/30/2018   Fibromyalgia    GERD (gastroesophageal reflux disease)    Hepatitis    history of "C" ,took treatments no longer have it   Hyperlipidemia    Hypertension    Pain in joint, lower leg 11/30/2010   Plantar wart, left foot 01/30/2018   Pneumonia    history   Primary osteoarthritis of left knee 05/27/2015   S/P total knee replacement 12/24/2016   Sacroiliitis, not elsewhere classified (HCC) 09/01/2020   Last Assessment & Plan:  Formatting of this note might be different from the original. She does describe some nondescript SI joint pain worse on the left.  Some of her symptoms are atypical in her pain is very chronic.  She reportedly has received temporary and repeated relief with multiple SI joint injections in the past.  The SI joint injections been more useful to her the lumbar epidural steroi   Sleep apnea    Spondylolisthesis of lumbar region 11/24/2020   Last Assessment & Plan:  Formatting of this note might be different from the original. I personally reviewed the MRI of the lumbar spine as well as flexion and extension lumbar x-rays.  This demonstrates a stable degenerative spondylolisthesis at L4-5.  I do not see overt nerve root compression.  Her pain is isolated to the sacroiliac region and does not radiate and therefore I would not  recommend   Thumb pain, left 07/05/2016    Past Surgical History:  Procedure Laterality Date   ABDOMINAL HYSTERECTOMY     CARPAL TUNNEL RELEASE Bilateral 1990's   CHOLECYSTECTOMY     HAND TENDON SURGERY Left    graft   JOINT REPLACEMENT Right 2009   knee   TOTAL KNEE ARTHROPLASTY Left 12/24/2016   Procedure: TOTAL KNEE ARTHROPLASTY;  Surgeon: Dannielle Huh, MD;  Location: [facility] OR;  Service: Orthopedics;  Laterality: Left;    Current Medications: Current Meds  Medication Sig   albuterol (VENTOLIN HFA) 108 (90 Base) MCG/ACT inhaler Inhale 2 puffs into the  lungs every 6 (six) hours as needed for wheezing or shortness of breath.   amLODipine-valsartan (EXFORGE) 5-160 MG tablet Take 1 tablet by mouth daily.   budesonide-formoterol (SYMBICORT) 160-4.5 MCG/ACT inhaler Inhale 2 puffs into the lungs 2 (two) times daily.   busPIRone (BUSPAR) 10 MG tablet Take 7.5 mg by mouth daily.   calcium-vitamin D 250-100 MG-UNIT tablet Take 1 tablet by mouth daily. 500 units   cetirizine (ZYRTEC) 10 MG tablet Take 10 mg by mouth daily.   dicyclomine (BENTYL) 10 MG capsule Take 10 mg by mouth daily.   DULoxetine (CYMBALTA) 60 MG capsule Take 60 mg by mouth daily.   ferrous sulfate 325 (65 FE) MG EC tablet Take 325 mg by mouth daily with breakfast.   fluticasone (FLONASE) 50 MCG/ACT nasal spray Place 2 sprays into both nostrils daily.   levalbuterol (XOPENEX) 0.63 MG/3ML nebulizer solution Take 0.63 mg by nebulization every 4 (four) hours as needed for wheezing or shortness of breath.   meloxicam (MOBIC) 7.5 MG tablet Take 7.5 mg by mouth in the morning and at bedtime.   nortriptyline (PAMELOR) 25 MG capsule Take 25 mg by mouth at bedtime.   Oxycodone HCl 10 MG TABS Take 10 mg by mouth every 6 (six) hours as needed (moderate pain).   pantoprazole (PROTONIX) 40 MG tablet Take 40 mg by mouth 2 (two) times daily.   pregabalin (LYRICA) 150 MG capsule Take 150 mg by mouth every 8 (eight) hours.   promethazine (PHENERGAN) 25 MG tablet Take 25 mg by mouth every 12 (twelve) hours as needed for nausea or vomiting.   rosuvastatin (CRESTOR) 20 MG tablet Take 20 mg by mouth daily.   vitamin E 400 UNIT capsule Take 400 Units by mouth daily.     Allergies:   Hylan g-f 20, Glucosamine-chondroitin, Prednisone, Codeine, Lisinopril, Sulfa antibiotics, Albuterol, Latex, and Morphine and related   Social History   Socioeconomic History   Marital status: Single    Spouse name: Not on file   Number of children: Not on file   Years of education: Not on file   Highest education  level: Not on file  Occupational History   Not on file  Tobacco Use   Smoking status: Every Day    Packs/day: 0.25    Years: 40.00    Pack years: 10.00    Types: Cigarettes   Smokeless tobacco: Never  Substance and Sexual Activity   Alcohol use: No   Drug use: No   Sexual activity: Not on file  Other Topics Concern   Not on file  Social History Narrative   Not on file   Social Determinants of Health   Financial Resource Strain: Not on file  Food Insecurity: Not on file  Transportation Needs: Not on file  Physical Activity: Not on file  Stress: Not on file  Social Connections: Not  on file     Family History: The patient's family history includes Heart disease in her maternal grandfather, maternal grandmother, and mother; Heart failure in her father. ROS:   Please see the history of present illness.    All 14 point review of systems negative except as described per history of present illness  EKGs/Labs/Other Studies Reviewed:      Recent Labs: No results found for requested labs within last 8760 hours.  Recent Lipid Panel No results found for: CHOL, TRIG, HDL, CHOLHDL, VLDL, LDLCALC, LDLDIRECT  Physical Exam:    VS:  BP (!) 160/86 (BP Location: Left Arm, Patient Position: Sitting)   Pulse 98   Ht 4\' 10"  (1.473 m)   Wt 141 lb 3.2 oz (64 kg)   SpO2 94%   BMI 29.51 kg/m     Wt Readings from Last 3 Encounters:  04/06/21 141 lb 3.2 oz (64 kg)  01/04/21 138 lb (62.6 kg)  12/30/20 138 lb (62.6 kg)     GEN:  Well nourished, well developed in no acute distress HEENT: Normal NECK: No JVD; No carotid bruits LYMPHATICS: No lymphadenopathy CARDIAC: RRR, no murmurs, no rubs, no gallops RESPIRATORY: Poor and bilaterally few wheezes ABDOMEN: Soft, non-tender, non-distended MUSCULOSKELETAL:  No edema; No deformity  SKIN: Warm and dry LOWER EXTREMITIES: no swelling NEUROLOGIC:  Alert and oriented x 3 PSYCHIATRIC:  Normal affect   ASSESSMENT:    1. Simple chronic  bronchitis (HCC)   2. Essential hypertension   3. Cigarette nicotine dependence with nicotine-induced disorder   4. Dyslipidemia   5. Preop cardiovascular exam    PLAN:    In order of problems listed above:  COPD which is advanced related to chronic smoking which is ongoing.  We did talk at least 10 minutes about need to quit she understands hopefully she will be able to accomplish that goal Cardiac evaluation before surgery.  Echocardiogram low normal ejection fraction, stress test no evidence of ischemia.  Overall from cardiac point of view should be reasonable candidate for surgery however clearly the biggest risk of the surgery comes from her overall status especially pulmonary.  100% agree with recommendation to quit smoking before surgery Dyslipidemia I did review her K PN however latest data in terms of cholesterol have is from 2021.  We will schedule her to have another fasting lipid profile done.   Medication Adjustments/Labs and Tests Ordered: Current medicines are reviewed at length with the patient today.  Concerns regarding medicines are outlined above.  No orders of the defined types were placed in this encounter.  Medication changes: No orders of the defined types were placed in this encounter.   Signed, 2022, MD, [facility] 04/06/2021 10:43 AM    [facility]

## 2021-04-06 NOTE — Patient Instructions (Signed)
Medication Instructions:  Your physician recommends that you continue on your current medications as directed. Please refer to the Current Medication list given to you today.  *If you need a refill on your cardiac medications before your next appointment, please call your pharmacy*   Lab Work: Your physician recommends that you return for lab work in: TODAY Lipids If you have labs (blood work) drawn today and your tests are completely normal, you will receive your results only by: . MyChart Message (if you have MyChart) OR . A paper copy in the mail If you have any lab test that is abnormal or we need to change your treatment, we will call you to review the results.   Testing/Procedures: None   Follow-Up: At [facility], you and your health needs are our priority.  As part of our continuing mission to provide you with exceptional heart care, we have created designated Provider Care Teams.  These Care Teams include your primary Cardiologist (physician) and Advanced Practice Providers (APPs -  Physician Assistants and Nurse Practitioners) who all work together to provide you with the care you need, when you need it.  We recommend signing up for the patient portal called "MyChart".  Sign up information is provided on this After Visit Summary.  MyChart is used to connect with patients for Virtual Visits (Telemedicine).  Patients are able to view lab/test results, encounter notes, upcoming appointments, etc.  Non-urgent messages can be sent to your provider as well.   To learn more about what you can do with MyChart, go to [email or web address].    Your next appointment:   6 month(s)  The format for your next appointment:   In Person  Provider:   Robert Krasowski, MD   Other Instructions   

## 2021-04-06 NOTE — Addendum Note (Signed)
Addended by: Reynolds Bowl on: 04/06/2021 10:55 AM   Modules accepted: Orders

## 2021-04-07 LAB — LIPID PANEL
Chol/HDL Ratio: 2.5 ratio (ref 0.0–4.4)
Cholesterol, Total: 160 mg/dL (ref 100–199)
HDL: 63 mg/dL (ref >39)
LDL Chol Calc (NIH): 74 mg/dL (ref 0–99)
Triglycerides: 130 mg/dL (ref 0–149)
VLDL Cholesterol Cal: 23 mg/dL (ref 5–40)

## 2021-04-09 DIAGNOSIS — J4540 Moderate persistent asthma, uncomplicated: Secondary | ICD-10-CM | POA: Diagnosis not present

## 2021-04-10 ENCOUNTER — Telehealth: Payer: Self-pay

## 2021-04-10 NOTE — Telephone Encounter (Signed)
-----   Message from Georgeanna Lea, MD sent at 04/10/2021 11:50 AM EDT ----- Cholesterol looks good, continue present management

## 2021-04-10 NOTE — Telephone Encounter (Signed)
Patient notified of results.

## 2021-04-13 DIAGNOSIS — J4540 Moderate persistent asthma, uncomplicated: Secondary | ICD-10-CM | POA: Diagnosis not present

## 2021-04-21 DIAGNOSIS — E785 Hyperlipidemia, unspecified: Secondary | ICD-10-CM | POA: Diagnosis not present

## 2021-04-21 DIAGNOSIS — K219 Gastro-esophageal reflux disease without esophagitis: Secondary | ICD-10-CM | POA: Diagnosis not present

## 2021-04-21 DIAGNOSIS — I10 Essential (primary) hypertension: Secondary | ICD-10-CM | POA: Diagnosis not present

## 2021-04-25 DIAGNOSIS — Z96651 Presence of right artificial knee joint: Secondary | ICD-10-CM | POA: Diagnosis not present

## 2021-04-25 DIAGNOSIS — Z79891 Long term (current) use of opiate analgesic: Secondary | ICD-10-CM | POA: Diagnosis not present

## 2021-04-25 DIAGNOSIS — Z96652 Presence of left artificial knee joint: Secondary | ICD-10-CM | POA: Diagnosis not present

## 2021-04-25 DIAGNOSIS — M7062 Trochanteric bursitis, left hip: Secondary | ICD-10-CM | POA: Diagnosis not present

## 2021-04-25 DIAGNOSIS — M48061 Spinal stenosis, lumbar region without neurogenic claudication: Secondary | ICD-10-CM | POA: Diagnosis not present

## 2021-04-25 DIAGNOSIS — Z1389 Encounter for screening for other disorder: Secondary | ICD-10-CM | POA: Diagnosis not present

## 2021-04-25 DIAGNOSIS — G894 Chronic pain syndrome: Secondary | ICD-10-CM | POA: Diagnosis not present

## 2021-04-25 DIAGNOSIS — M5416 Radiculopathy, lumbar region: Secondary | ICD-10-CM | POA: Diagnosis not present

## 2021-04-25 DIAGNOSIS — M5136 Other intervertebral disc degeneration, lumbar region: Secondary | ICD-10-CM | POA: Diagnosis not present

## 2021-04-25 DIAGNOSIS — M4316 Spondylolisthesis, lumbar region: Secondary | ICD-10-CM | POA: Diagnosis not present

## 2021-04-25 DIAGNOSIS — M461 Sacroiliitis, not elsewhere classified: Secondary | ICD-10-CM | POA: Diagnosis not present

## 2021-04-25 DIAGNOSIS — G2581 Restless legs syndrome: Secondary | ICD-10-CM | POA: Diagnosis not present

## 2021-05-09 DIAGNOSIS — J4540 Moderate persistent asthma, uncomplicated: Secondary | ICD-10-CM | POA: Diagnosis not present

## 2021-05-09 DIAGNOSIS — M47818 Spondylosis without myelopathy or radiculopathy, sacral and sacrococcygeal region: Secondary | ICD-10-CM | POA: Diagnosis not present

## 2021-05-13 DIAGNOSIS — J4540 Moderate persistent asthma, uncomplicated: Secondary | ICD-10-CM | POA: Diagnosis not present

## 2021-05-22 DIAGNOSIS — F410 Panic disorder [episodic paroxysmal anxiety] without agoraphobia: Secondary | ICD-10-CM | POA: Diagnosis not present

## 2021-05-22 DIAGNOSIS — E119 Type 2 diabetes mellitus without complications: Secondary | ICD-10-CM | POA: Diagnosis not present

## 2021-05-22 DIAGNOSIS — I10 Essential (primary) hypertension: Secondary | ICD-10-CM | POA: Diagnosis not present

## 2021-05-25 DIAGNOSIS — M48061 Spinal stenosis, lumbar region without neurogenic claudication: Secondary | ICD-10-CM | POA: Diagnosis not present

## 2021-05-25 DIAGNOSIS — G894 Chronic pain syndrome: Secondary | ICD-10-CM | POA: Diagnosis not present

## 2021-05-25 DIAGNOSIS — M4316 Spondylolisthesis, lumbar region: Secondary | ICD-10-CM | POA: Diagnosis not present

## 2021-05-25 DIAGNOSIS — M461 Sacroiliitis, not elsewhere classified: Secondary | ICD-10-CM | POA: Diagnosis not present

## 2021-05-25 DIAGNOSIS — Z79891 Long term (current) use of opiate analgesic: Secondary | ICD-10-CM | POA: Diagnosis not present

## 2021-05-25 DIAGNOSIS — Z96652 Presence of left artificial knee joint: Secondary | ICD-10-CM | POA: Diagnosis not present

## 2021-05-25 DIAGNOSIS — Z96651 Presence of right artificial knee joint: Secondary | ICD-10-CM | POA: Diagnosis not present

## 2021-05-25 DIAGNOSIS — M7062 Trochanteric bursitis, left hip: Secondary | ICD-10-CM | POA: Diagnosis not present

## 2021-05-25 DIAGNOSIS — M5416 Radiculopathy, lumbar region: Secondary | ICD-10-CM | POA: Diagnosis not present

## 2021-06-12 DIAGNOSIS — F17210 Nicotine dependence, cigarettes, uncomplicated: Secondary | ICD-10-CM | POA: Diagnosis not present

## 2021-06-12 DIAGNOSIS — J4540 Moderate persistent asthma, uncomplicated: Secondary | ICD-10-CM | POA: Diagnosis not present

## 2021-06-12 DIAGNOSIS — J301 Allergic rhinitis due to pollen: Secondary | ICD-10-CM | POA: Diagnosis not present

## 2021-06-21 DIAGNOSIS — E119 Type 2 diabetes mellitus without complications: Secondary | ICD-10-CM | POA: Diagnosis not present

## 2021-06-21 DIAGNOSIS — I10 Essential (primary) hypertension: Secondary | ICD-10-CM | POA: Diagnosis not present

## 2021-06-23 DIAGNOSIS — Z79891 Long term (current) use of opiate analgesic: Secondary | ICD-10-CM | POA: Diagnosis not present

## 2021-06-23 DIAGNOSIS — Z96652 Presence of left artificial knee joint: Secondary | ICD-10-CM | POA: Diagnosis not present

## 2021-06-23 DIAGNOSIS — M7062 Trochanteric bursitis, left hip: Secondary | ICD-10-CM | POA: Diagnosis not present

## 2021-06-23 DIAGNOSIS — M461 Sacroiliitis, not elsewhere classified: Secondary | ICD-10-CM | POA: Diagnosis not present

## 2021-06-23 DIAGNOSIS — Z96651 Presence of right artificial knee joint: Secondary | ICD-10-CM | POA: Diagnosis not present

## 2021-06-23 DIAGNOSIS — G894 Chronic pain syndrome: Secondary | ICD-10-CM | POA: Diagnosis not present

## 2021-06-23 DIAGNOSIS — M48061 Spinal stenosis, lumbar region without neurogenic claudication: Secondary | ICD-10-CM | POA: Diagnosis not present

## 2021-06-23 DIAGNOSIS — M5416 Radiculopathy, lumbar region: Secondary | ICD-10-CM | POA: Diagnosis not present

## 2021-06-23 DIAGNOSIS — M4316 Spondylolisthesis, lumbar region: Secondary | ICD-10-CM | POA: Diagnosis not present

## 2021-06-26 DIAGNOSIS — R0600 Dyspnea, unspecified: Secondary | ICD-10-CM | POA: Diagnosis not present

## 2021-06-26 DIAGNOSIS — I10 Essential (primary) hypertension: Secondary | ICD-10-CM | POA: Diagnosis not present

## 2021-06-26 DIAGNOSIS — J342 Deviated nasal septum: Secondary | ICD-10-CM | POA: Diagnosis not present

## 2021-06-26 DIAGNOSIS — F17200 Nicotine dependence, unspecified, uncomplicated: Secondary | ICD-10-CM | POA: Diagnosis not present

## 2021-06-26 DIAGNOSIS — H9319 Tinnitus, unspecified ear: Secondary | ICD-10-CM | POA: Diagnosis not present

## 2021-06-26 DIAGNOSIS — H9190 Unspecified hearing loss, unspecified ear: Secondary | ICD-10-CM | POA: Diagnosis not present

## 2021-06-26 DIAGNOSIS — E119 Type 2 diabetes mellitus without complications: Secondary | ICD-10-CM | POA: Diagnosis not present

## 2021-06-26 DIAGNOSIS — J449 Chronic obstructive pulmonary disease, unspecified: Secondary | ICD-10-CM | POA: Diagnosis not present

## 2021-07-10 DIAGNOSIS — R232 Flushing: Secondary | ICD-10-CM | POA: Diagnosis not present

## 2021-07-10 DIAGNOSIS — N951 Menopausal and female climacteric states: Secondary | ICD-10-CM | POA: Diagnosis not present

## 2021-07-21 DIAGNOSIS — E119 Type 2 diabetes mellitus without complications: Secondary | ICD-10-CM | POA: Diagnosis not present

## 2021-07-21 DIAGNOSIS — I10 Essential (primary) hypertension: Secondary | ICD-10-CM | POA: Diagnosis not present

## 2021-07-25 DIAGNOSIS — Z96651 Presence of right artificial knee joint: Secondary | ICD-10-CM | POA: Diagnosis not present

## 2021-07-25 DIAGNOSIS — M7062 Trochanteric bursitis, left hip: Secondary | ICD-10-CM | POA: Diagnosis not present

## 2021-07-25 DIAGNOSIS — M5416 Radiculopathy, lumbar region: Secondary | ICD-10-CM | POA: Diagnosis not present

## 2021-07-25 DIAGNOSIS — G894 Chronic pain syndrome: Secondary | ICD-10-CM | POA: Diagnosis not present

## 2021-07-25 DIAGNOSIS — Z96652 Presence of left artificial knee joint: Secondary | ICD-10-CM | POA: Diagnosis not present

## 2021-07-25 DIAGNOSIS — M48061 Spinal stenosis, lumbar region without neurogenic claudication: Secondary | ICD-10-CM | POA: Diagnosis not present

## 2021-07-25 DIAGNOSIS — D500 Iron deficiency anemia secondary to blood loss (chronic): Secondary | ICD-10-CM | POA: Diagnosis not present

## 2021-07-25 DIAGNOSIS — M4316 Spondylolisthesis, lumbar region: Secondary | ICD-10-CM | POA: Diagnosis not present

## 2021-07-25 DIAGNOSIS — M461 Sacroiliitis, not elsewhere classified: Secondary | ICD-10-CM | POA: Diagnosis not present

## 2021-07-25 DIAGNOSIS — Z79891 Long term (current) use of opiate analgesic: Secondary | ICD-10-CM | POA: Diagnosis not present

## 2021-07-31 DIAGNOSIS — H9193 Unspecified hearing loss, bilateral: Secondary | ICD-10-CM | POA: Diagnosis not present

## 2021-07-31 DIAGNOSIS — Z1212 Encounter for screening for malignant neoplasm of rectum: Secondary | ICD-10-CM | POA: Diagnosis not present

## 2021-07-31 DIAGNOSIS — D500 Iron deficiency anemia secondary to blood loss (chronic): Secondary | ICD-10-CM | POA: Diagnosis not present

## 2021-07-31 DIAGNOSIS — J449 Chronic obstructive pulmonary disease, unspecified: Secondary | ICD-10-CM | POA: Diagnosis not present

## 2021-07-31 DIAGNOSIS — R195 Other fecal abnormalities: Secondary | ICD-10-CM | POA: Diagnosis not present

## 2021-07-31 DIAGNOSIS — H903 Sensorineural hearing loss, bilateral: Secondary | ICD-10-CM | POA: Diagnosis not present

## 2021-07-31 DIAGNOSIS — F17200 Nicotine dependence, unspecified, uncomplicated: Secondary | ICD-10-CM | POA: Diagnosis not present

## 2021-07-31 DIAGNOSIS — J342 Deviated nasal septum: Secondary | ICD-10-CM | POA: Diagnosis not present

## 2021-08-22 DIAGNOSIS — E119 Type 2 diabetes mellitus without complications: Secondary | ICD-10-CM | POA: Diagnosis not present

## 2021-08-22 DIAGNOSIS — I10 Essential (primary) hypertension: Secondary | ICD-10-CM | POA: Diagnosis not present

## 2021-09-22 DIAGNOSIS — M461 Sacroiliitis, not elsewhere classified: Secondary | ICD-10-CM | POA: Diagnosis not present

## 2021-09-22 DIAGNOSIS — M5136 Other intervertebral disc degeneration, lumbar region: Secondary | ICD-10-CM | POA: Diagnosis not present

## 2021-09-22 DIAGNOSIS — Z96652 Presence of left artificial knee joint: Secondary | ICD-10-CM | POA: Diagnosis not present

## 2021-09-22 DIAGNOSIS — M5416 Radiculopathy, lumbar region: Secondary | ICD-10-CM | POA: Diagnosis not present

## 2021-09-22 DIAGNOSIS — M48061 Spinal stenosis, lumbar region without neurogenic claudication: Secondary | ICD-10-CM | POA: Diagnosis not present

## 2021-09-22 DIAGNOSIS — M7062 Trochanteric bursitis, left hip: Secondary | ICD-10-CM | POA: Diagnosis not present

## 2021-09-22 DIAGNOSIS — G2581 Restless legs syndrome: Secondary | ICD-10-CM | POA: Diagnosis not present

## 2021-09-22 DIAGNOSIS — Z96651 Presence of right artificial knee joint: Secondary | ICD-10-CM | POA: Diagnosis not present

## 2021-09-22 DIAGNOSIS — M4316 Spondylolisthesis, lumbar region: Secondary | ICD-10-CM | POA: Diagnosis not present

## 2021-09-27 DIAGNOSIS — H524 Presbyopia: Secondary | ICD-10-CM | POA: Diagnosis not present

## 2021-09-27 DIAGNOSIS — E119 Type 2 diabetes mellitus without complications: Secondary | ICD-10-CM | POA: Diagnosis not present

## 2021-10-05 ENCOUNTER — Ambulatory Visit: Payer: Medicare Other | Admitting: Cardiology

## 2021-10-07 DIAGNOSIS — J4540 Moderate persistent asthma, uncomplicated: Secondary | ICD-10-CM | POA: Diagnosis not present

## 2021-10-11 DIAGNOSIS — J4540 Moderate persistent asthma, uncomplicated: Secondary | ICD-10-CM | POA: Diagnosis not present

## 2021-10-20 DIAGNOSIS — E785 Hyperlipidemia, unspecified: Secondary | ICD-10-CM | POA: Diagnosis not present

## 2021-10-20 DIAGNOSIS — I10 Essential (primary) hypertension: Secondary | ICD-10-CM | POA: Diagnosis not present

## 2021-10-20 DIAGNOSIS — M4316 Spondylolisthesis, lumbar region: Secondary | ICD-10-CM | POA: Diagnosis not present

## 2021-10-20 DIAGNOSIS — Z96651 Presence of right artificial knee joint: Secondary | ICD-10-CM | POA: Diagnosis not present

## 2021-10-20 DIAGNOSIS — M5136 Other intervertebral disc degeneration, lumbar region: Secondary | ICD-10-CM | POA: Diagnosis not present

## 2021-10-20 DIAGNOSIS — G2581 Restless legs syndrome: Secondary | ICD-10-CM | POA: Diagnosis not present

## 2021-10-20 DIAGNOSIS — M5416 Radiculopathy, lumbar region: Secondary | ICD-10-CM | POA: Diagnosis not present

## 2021-10-20 DIAGNOSIS — M7062 Trochanteric bursitis, left hip: Secondary | ICD-10-CM | POA: Diagnosis not present

## 2021-10-20 DIAGNOSIS — Z96652 Presence of left artificial knee joint: Secondary | ICD-10-CM | POA: Diagnosis not present

## 2021-10-20 DIAGNOSIS — M461 Sacroiliitis, not elsewhere classified: Secondary | ICD-10-CM | POA: Diagnosis not present

## 2021-10-20 DIAGNOSIS — M48061 Spinal stenosis, lumbar region without neurogenic claudication: Secondary | ICD-10-CM | POA: Diagnosis not present

## 2021-11-06 DIAGNOSIS — Z6829 Body mass index (BMI) 29.0-29.9, adult: Secondary | ICD-10-CM | POA: Diagnosis not present

## 2021-11-06 DIAGNOSIS — N898 Other specified noninflammatory disorders of vagina: Secondary | ICD-10-CM | POA: Diagnosis not present

## 2021-11-17 DIAGNOSIS — Z79891 Long term (current) use of opiate analgesic: Secondary | ICD-10-CM | POA: Diagnosis not present

## 2021-11-17 DIAGNOSIS — M48061 Spinal stenosis, lumbar region without neurogenic claudication: Secondary | ICD-10-CM | POA: Diagnosis not present

## 2021-11-17 DIAGNOSIS — M5416 Radiculopathy, lumbar region: Secondary | ICD-10-CM | POA: Diagnosis not present

## 2021-11-17 DIAGNOSIS — Z96651 Presence of right artificial knee joint: Secondary | ICD-10-CM | POA: Diagnosis not present

## 2021-11-17 DIAGNOSIS — G894 Chronic pain syndrome: Secondary | ICD-10-CM | POA: Diagnosis not present

## 2021-11-17 DIAGNOSIS — M5136 Other intervertebral disc degeneration, lumbar region: Secondary | ICD-10-CM | POA: Diagnosis not present

## 2021-11-17 DIAGNOSIS — M4316 Spondylolisthesis, lumbar region: Secondary | ICD-10-CM | POA: Diagnosis not present

## 2021-11-17 DIAGNOSIS — M7062 Trochanteric bursitis, left hip: Secondary | ICD-10-CM | POA: Diagnosis not present

## 2021-11-17 DIAGNOSIS — Z96652 Presence of left artificial knee joint: Secondary | ICD-10-CM | POA: Diagnosis not present

## 2021-11-17 DIAGNOSIS — M461 Sacroiliitis, not elsewhere classified: Secondary | ICD-10-CM | POA: Diagnosis not present

## 2021-11-19 DIAGNOSIS — E119 Type 2 diabetes mellitus without complications: Secondary | ICD-10-CM | POA: Diagnosis not present

## 2021-11-19 DIAGNOSIS — I10 Essential (primary) hypertension: Secondary | ICD-10-CM | POA: Diagnosis not present

## 2021-11-19 DIAGNOSIS — F410 Panic disorder [episodic paroxysmal anxiety] without agoraphobia: Secondary | ICD-10-CM | POA: Diagnosis not present

## 2021-11-21 DIAGNOSIS — H52209 Unspecified astigmatism, unspecified eye: Secondary | ICD-10-CM | POA: Diagnosis not present

## 2021-11-21 DIAGNOSIS — H5213 Myopia, bilateral: Secondary | ICD-10-CM | POA: Diagnosis not present

## 2021-11-21 DIAGNOSIS — H524 Presbyopia: Secondary | ICD-10-CM | POA: Diagnosis not present

## 2021-11-24 ENCOUNTER — Ambulatory Visit: Payer: Medicare HMO | Admitting: Cardiology

## 2021-11-24 ENCOUNTER — Encounter: Payer: Self-pay | Admitting: Cardiology

## 2021-11-24 VITALS — BP 108/72 | HR 89 | Ht <= 58 in | Wt 140.0 lb

## 2021-11-24 DIAGNOSIS — J4540 Moderate persistent asthma, uncomplicated: Secondary | ICD-10-CM | POA: Diagnosis not present

## 2021-11-24 DIAGNOSIS — E785 Hyperlipidemia, unspecified: Secondary | ICD-10-CM | POA: Diagnosis not present

## 2021-11-24 DIAGNOSIS — I10 Essential (primary) hypertension: Secondary | ICD-10-CM

## 2021-11-24 DIAGNOSIS — J410 Simple chronic bronchitis: Secondary | ICD-10-CM | POA: Diagnosis not present

## 2021-11-24 DIAGNOSIS — J449 Chronic obstructive pulmonary disease, unspecified: Secondary | ICD-10-CM | POA: Diagnosis not present

## 2021-11-24 DIAGNOSIS — E119 Type 2 diabetes mellitus without complications: Secondary | ICD-10-CM | POA: Diagnosis not present

## 2021-11-24 DIAGNOSIS — F17219 Nicotine dependence, cigarettes, with unspecified nicotine-induced disorders: Secondary | ICD-10-CM | POA: Diagnosis not present

## 2021-11-24 NOTE — Progress Notes (Signed)
?Cardiology Office Note:   ? ?Date:  11/24/2021  ? ?ID:  Natasha Meyer, DOB 06/04/1956, MRN [id number] ? ?PCP:  Marylen Ponto, MD  ?Cardiologist:  Gypsy Balsam, MD   ? ?Referring MD: Marylen Ponto, MD  ? ?Chief Complaint  ?Patient presents with  ? Follow-up  ?Doing well ? ?History of Present Illness:   ? ?Natasha Meyer is a 66 y.o. female with past medical history significant for essential hypertension, diabetes which appears to be diet controlled, dyslipidemia, she is a chronic smoker does suffer with advanced COPD she is on oxygen chronically.  She was sent to [place] before orthopedic surgery for evaluation from cardiovascular point review echocardiogram has been performed which showed lower limits of normal ejection fraction, stress test was performed showing no evidence of ischemia overall she seems to be doing well she comes today for regular follow-up.  She eventually decided not to have surgery on her back.  She said she will discover what was the problem she had roses that she had to attend to she was bending many times and that is what created problem now she got rid of process and things are much better.  Denies have any chest pain tightness squeezing pressure mid chest, exertional shortness of breath obviously still there ? ?Past Medical History:  ?Diagnosis Date  ? Aches 11/30/2010  ? Formatting of this note might be different from the original. Widespread  ? Anemia   ? Arthritis   ? Asthma   ? Chronic hepatitis C virus infection (HCC) 05/07/2013  ? Cigarette nicotine dependence with nicotine-induced disorder 11/24/2020  ? Last Assessment & Plan:  Formatting of this note might be different from the original. I spent a total of 4 minutes with the patient counseling them on smoking cessation efforts.  She is aware of the medical complications of smoking including adverse events of lung cancer, emphysema, poor wound healing etc.  She has attempted to quit in the past.Barriers to quitting and avoidance of causes  of rela  ? Depression   ? Diabetes mellitus type II, controlled (HCC) 01/30/2018  ? Fibromyalgia   ? GERD (gastroesophageal reflux disease)   ? Hepatitis   ? history of "C" ,took treatments no longer have it  ? Hyperlipidemia   ? Hypertension   ? Pain in joint, lower leg 11/30/2010  ? Plantar wart, left foot 01/30/2018  ? Pneumonia   ? history  ? Primary osteoarthritis of left knee 05/27/2015  ? S/P total knee replacement 12/24/2016  ? Sacroiliitis, not elsewhere classified (HCC) 09/01/2020  ? Last Assessment & Plan:  Formatting of this note might be different from the original. She does describe some nondescript SI joint pain worse on the left.  Some of her symptoms are atypical in her pain is very chronic.  She reportedly has received temporary and repeated relief with multiple SI joint injections in the past.  The SI joint injections been more useful to her the lumbar epidural steroi  ? Sleep apnea   ? Spondylolisthesis of lumbar region 11/24/2020  ? Last Assessment & Plan:  Formatting of this note might be different from the original. I personally reviewed the MRI of the lumbar spine as well as flexion and extension lumbar x-rays.  This demonstrates a stable degenerative spondylolisthesis at L4-5.  I do not see overt nerve root compression.  Her pain is isolated to the sacroiliac region and does not radiate and therefore I would not recommend  ? Thumb pain,  left 07/05/2016  ? ? ?Past Surgical History:  ?Procedure Laterality Date  ? ABDOMINAL HYSTERECTOMY    ? CARPAL TUNNEL RELEASE Bilateral 1990's  ? CHOLECYSTECTOMY    ? HAND TENDON SURGERY Left   ? graft  ? JOINT REPLACEMENT Right 2009  ? knee  ? TOTAL KNEE ARTHROPLASTY Left 12/24/2016  ? Procedure: TOTAL KNEE ARTHROPLASTY;  Surgeon: Dannielle HuhLucey, Steve, MD;  Location: [facility] OR;  Service: Orthopedics;  Laterality: Left;  ? ? ?Current Medications: ?Current Meds  ?Medication Sig  ? albuterol (VENTOLIN HFA) 108 (90 Base) MCG/ACT inhaler Inhale 2 puffs into the lungs every 6 (six)  hours as needed for wheezing or shortness of breath.  ? amLODipine-valsartan (EXFORGE) 5-160 MG tablet Take 1 tablet by mouth daily.  ? busPIRone (BUSPAR) 10 MG tablet Take 7.5 mg by mouth daily.  ? calcium-vitamin D 250-100 MG-UNIT tablet Take 1 tablet by mouth daily. 500 units  ? cetirizine (ZYRTEC) 10 MG tablet Take 10 mg by mouth daily.  ? dicyclomine (BENTYL) 10 MG capsule Take 10 mg by mouth daily.  ? DULoxetine (CYMBALTA) 60 MG capsule Take 60 mg by mouth daily.  ? ferrous sulfate 325 (65 FE) MG EC tablet Take 325 mg by mouth daily with breakfast.  ? fluticasone (FLONASE) 50 MCG/ACT nasal spray Place 2 sprays into both nostrils daily.  ? Fluticasone-Umeclidin-Vilant (TRELEGY ELLIPTA) 200-62.5-25 MCG/ACT AEPB Inhale 1 puff into the lungs daily.  ? levalbuterol (XOPENEX) 0.63 MG/3ML nebulizer solution Take 0.63 mg by nebulization every 4 (four) hours as needed for wheezing or shortness of breath.  ? meloxicam (MOBIC) 7.5 MG tablet Take 7.5 mg by mouth in the morning and at bedtime.  ? nortriptyline (PAMELOR) 25 MG capsule Take 25 mg by mouth at bedtime.  ? Oxycodone HCl 10 MG TABS Take 10 mg by mouth every 6 (six) hours as needed (moderate pain).  ? pantoprazole (PROTONIX) 40 MG tablet Take 40 mg by mouth 2 (two) times daily.  ? pregabalin (LYRICA) 150 MG capsule Take 150 mg by mouth every 8 (eight) hours.  ? promethazine (PHENERGAN) 25 MG tablet Take 25 mg by mouth every 12 (twelve) hours as needed for nausea or vomiting.  ? rosuvastatin (CRESTOR) 20 MG tablet Take 20 mg by mouth daily.  ? vitamin E 400 UNIT capsule Take 400 Units by mouth daily.  ? [DISCONTINUED] budesonide-formoterol (SYMBICORT) 160-4.5 MCG/ACT inhaler Inhale 2 puffs into the lungs 2 (two) times daily.  ?  ? ?Allergies:   Hylan g-f 20, Glucosamine-chondroitin, Prednisone, Codeine, Lisinopril, Sulfa antibiotics, Albuterol, Latex, and Morphine and related  ? ?Social History  ? ?Socioeconomic History  ? Marital status: Single  ?  Spouse name:  Not on file  ? Number of children: Not on file  ? Years of education: Not on file  ? Highest education level: Not on file  ?Occupational History  ? Not on file  ?Tobacco Use  ? Smoking status: Every Day  ?  Packs/day: 0.25  ?  Years: 40.00  ?  Pack years: 10.00  ?  Types: Cigarettes  ? Smokeless tobacco: Never  ?Substance and Sexual Activity  ? Alcohol use: No  ? Drug use: No  ? Sexual activity: Not on file  ?Other Topics Concern  ? Not on file  ?Social History Narrative  ? Not on file  ? ?Social Determinants of Health  ? ?Financial Resource Strain: Not on file  ?Food Insecurity: Not on file  ?Transportation Needs: Not on file  ?Physical Activity: Not  on file  ?Stress: Not on file  ?Social Connections: Not on file  ?  ? ?Family History: ?The patient's family history includes Heart disease in her maternal grandfather, maternal grandmother, and mother; Heart failure in her father. ?ROS:   ?Please see the history of present illness.    ?All 14 point review of systems negative except as described per history of present illness ? ?EKGs/Labs/Other Studies Reviewed:   ? ? ? ?Recent Labs: ?No results found for requested labs within last 8760 hours.  ?Recent Lipid Panel ?   ?Component Value Date/Time  ? CHOL 160 04/06/2021 1059  ? TRIG 130 04/06/2021 1059  ? HDL 63 04/06/2021 1059  ? CHOLHDL 2.5 04/06/2021 1059  ? LDLCALC 74 04/06/2021 1059  ? ? ?Physical Exam:   ? ?VS:  BP 108/72 (BP Location: Right Arm, Patient Position: Sitting)   Pulse 89   Ht 4\' 10"  (1.473 m)   Wt 140 lb (63.5 kg)   SpO2 97%   BMI 29.26 kg/m?    ? ?Wt Readings from Last 3 Encounters:  ?11/24/21 140 lb (63.5 kg)  ?04/06/21 141 lb 3.2 oz (64 kg)  ?01/04/21 138 lb (62.6 kg)  ?  ? ?GEN:  Well nourished, well developed in no acute distress ?HEENT: Normal ?NECK: No JVD; No carotid bruits ?LYMPHATICS: No lymphadenopathy ?CARDIAC: RRR, no murmurs, no rubs, no gallops ?RESPIRATORY: Poor entry bilaterally bilateral wheezes ?ABDOMEN: Soft, non-tender,  non-distended ?MUSCULOSKELETAL:  No edema; No deformity  ?SKIN: Warm and dry ?LOWER EXTREMITIES: no swelling ?NEUROLOGIC:  Alert and oriented x 3 ?PSYCHIATRIC:  Normal affect  ? ?ASSESSMENT:   ? ?1. Essential hyperten

## 2021-11-24 NOTE — Patient Instructions (Signed)

## 2021-11-27 DIAGNOSIS — J4540 Moderate persistent asthma, uncomplicated: Secondary | ICD-10-CM | POA: Diagnosis not present

## 2021-11-27 DIAGNOSIS — F17210 Nicotine dependence, cigarettes, uncomplicated: Secondary | ICD-10-CM | POA: Diagnosis not present

## 2021-11-27 DIAGNOSIS — J301 Allergic rhinitis due to pollen: Secondary | ICD-10-CM | POA: Diagnosis not present

## 2021-12-16 DIAGNOSIS — H9209 Otalgia, unspecified ear: Secondary | ICD-10-CM | POA: Diagnosis not present

## 2021-12-20 DIAGNOSIS — Z96651 Presence of right artificial knee joint: Secondary | ICD-10-CM | POA: Diagnosis not present

## 2021-12-20 DIAGNOSIS — M48061 Spinal stenosis, lumbar region without neurogenic claudication: Secondary | ICD-10-CM | POA: Diagnosis not present

## 2021-12-20 DIAGNOSIS — M5416 Radiculopathy, lumbar region: Secondary | ICD-10-CM | POA: Diagnosis not present

## 2021-12-20 DIAGNOSIS — M7062 Trochanteric bursitis, left hip: Secondary | ICD-10-CM | POA: Diagnosis not present

## 2021-12-20 DIAGNOSIS — M461 Sacroiliitis, not elsewhere classified: Secondary | ICD-10-CM | POA: Diagnosis not present

## 2021-12-20 DIAGNOSIS — M4316 Spondylolisthesis, lumbar region: Secondary | ICD-10-CM | POA: Diagnosis not present

## 2021-12-20 DIAGNOSIS — Z79891 Long term (current) use of opiate analgesic: Secondary | ICD-10-CM | POA: Diagnosis not present

## 2021-12-20 DIAGNOSIS — Z96652 Presence of left artificial knee joint: Secondary | ICD-10-CM | POA: Diagnosis not present

## 2021-12-20 DIAGNOSIS — G894 Chronic pain syndrome: Secondary | ICD-10-CM | POA: Diagnosis not present

## 2021-12-25 DIAGNOSIS — J4540 Moderate persistent asthma, uncomplicated: Secondary | ICD-10-CM | POA: Diagnosis not present

## 2021-12-25 DIAGNOSIS — J449 Chronic obstructive pulmonary disease, unspecified: Secondary | ICD-10-CM | POA: Diagnosis not present

## 2021-12-25 DIAGNOSIS — E119 Type 2 diabetes mellitus without complications: Secondary | ICD-10-CM | POA: Diagnosis not present

## 2021-12-26 DIAGNOSIS — E119 Type 2 diabetes mellitus without complications: Secondary | ICD-10-CM | POA: Diagnosis not present

## 2021-12-26 DIAGNOSIS — Z1331 Encounter for screening for depression: Secondary | ICD-10-CM | POA: Diagnosis not present

## 2021-12-26 DIAGNOSIS — R519 Headache, unspecified: Secondary | ICD-10-CM | POA: Diagnosis not present

## 2021-12-26 DIAGNOSIS — Z6829 Body mass index (BMI) 29.0-29.9, adult: Secondary | ICD-10-CM | POA: Diagnosis not present

## 2021-12-26 DIAGNOSIS — J329 Chronic sinusitis, unspecified: Secondary | ICD-10-CM | POA: Diagnosis not present

## 2021-12-26 DIAGNOSIS — Z0000 Encounter for general adult medical examination without abnormal findings: Secondary | ICD-10-CM | POA: Diagnosis not present

## 2022-01-09 DIAGNOSIS — M5416 Radiculopathy, lumbar region: Secondary | ICD-10-CM | POA: Diagnosis not present

## 2022-01-19 DIAGNOSIS — G894 Chronic pain syndrome: Secondary | ICD-10-CM | POA: Diagnosis not present

## 2022-01-19 DIAGNOSIS — M48061 Spinal stenosis, lumbar region without neurogenic claudication: Secondary | ICD-10-CM | POA: Diagnosis not present

## 2022-01-19 DIAGNOSIS — M7062 Trochanteric bursitis, left hip: Secondary | ICD-10-CM | POA: Diagnosis not present

## 2022-01-19 DIAGNOSIS — Z79891 Long term (current) use of opiate analgesic: Secondary | ICD-10-CM | POA: Diagnosis not present

## 2022-01-19 DIAGNOSIS — Z96652 Presence of left artificial knee joint: Secondary | ICD-10-CM | POA: Diagnosis not present

## 2022-01-19 DIAGNOSIS — M461 Sacroiliitis, not elsewhere classified: Secondary | ICD-10-CM | POA: Diagnosis not present

## 2022-01-19 DIAGNOSIS — Z96651 Presence of right artificial knee joint: Secondary | ICD-10-CM | POA: Diagnosis not present

## 2022-01-19 DIAGNOSIS — M5416 Radiculopathy, lumbar region: Secondary | ICD-10-CM | POA: Diagnosis not present

## 2022-01-19 DIAGNOSIS — M4316 Spondylolisthesis, lumbar region: Secondary | ICD-10-CM | POA: Diagnosis not present

## 2022-01-22 DIAGNOSIS — D500 Iron deficiency anemia secondary to blood loss (chronic): Secondary | ICD-10-CM | POA: Diagnosis not present

## 2022-01-24 DIAGNOSIS — E119 Type 2 diabetes mellitus without complications: Secondary | ICD-10-CM | POA: Diagnosis not present

## 2022-01-24 DIAGNOSIS — J449 Chronic obstructive pulmonary disease, unspecified: Secondary | ICD-10-CM | POA: Diagnosis not present

## 2022-01-24 DIAGNOSIS — J4540 Moderate persistent asthma, uncomplicated: Secondary | ICD-10-CM | POA: Diagnosis not present

## 2022-01-29 DIAGNOSIS — R195 Other fecal abnormalities: Secondary | ICD-10-CM | POA: Diagnosis not present

## 2022-01-29 DIAGNOSIS — D500 Iron deficiency anemia secondary to blood loss (chronic): Secondary | ICD-10-CM | POA: Diagnosis not present

## 2022-02-02 DIAGNOSIS — Z1231 Encounter for screening mammogram for malignant neoplasm of breast: Secondary | ICD-10-CM | POA: Diagnosis not present

## 2022-02-06 DIAGNOSIS — M1612 Unilateral primary osteoarthritis, left hip: Secondary | ICD-10-CM | POA: Diagnosis not present

## 2022-02-14 DIAGNOSIS — I2510 Atherosclerotic heart disease of native coronary artery without angina pectoris: Secondary | ICD-10-CM | POA: Diagnosis not present

## 2022-02-14 DIAGNOSIS — F17210 Nicotine dependence, cigarettes, uncomplicated: Secondary | ICD-10-CM | POA: Diagnosis not present

## 2022-02-14 DIAGNOSIS — J432 Centrilobular emphysema: Secondary | ICD-10-CM | POA: Diagnosis not present

## 2022-02-14 DIAGNOSIS — I700 Atherosclerosis of aorta: Secondary | ICD-10-CM | POA: Diagnosis not present

## 2022-02-14 DIAGNOSIS — Z122 Encounter for screening for malignant neoplasm of respiratory organs: Secondary | ICD-10-CM | POA: Diagnosis not present

## 2022-02-14 DIAGNOSIS — K760 Fatty (change of) liver, not elsewhere classified: Secondary | ICD-10-CM | POA: Diagnosis not present

## 2022-02-20 DIAGNOSIS — M7062 Trochanteric bursitis, left hip: Secondary | ICD-10-CM | POA: Diagnosis not present

## 2022-02-21 DIAGNOSIS — F17210 Nicotine dependence, cigarettes, uncomplicated: Secondary | ICD-10-CM | POA: Diagnosis not present

## 2022-02-21 DIAGNOSIS — J301 Allergic rhinitis due to pollen: Secondary | ICD-10-CM | POA: Diagnosis not present

## 2022-02-21 DIAGNOSIS — J4540 Moderate persistent asthma, uncomplicated: Secondary | ICD-10-CM | POA: Diagnosis not present

## 2022-02-24 DIAGNOSIS — J449 Chronic obstructive pulmonary disease, unspecified: Secondary | ICD-10-CM | POA: Diagnosis not present

## 2022-02-24 DIAGNOSIS — J4540 Moderate persistent asthma, uncomplicated: Secondary | ICD-10-CM | POA: Diagnosis not present

## 2022-02-24 DIAGNOSIS — E119 Type 2 diabetes mellitus without complications: Secondary | ICD-10-CM | POA: Diagnosis not present

## 2022-02-27 DIAGNOSIS — M5136 Other intervertebral disc degeneration, lumbar region: Secondary | ICD-10-CM | POA: Diagnosis not present

## 2022-02-27 DIAGNOSIS — R102 Pelvic and perineal pain: Secondary | ICD-10-CM | POA: Diagnosis not present

## 2022-02-27 DIAGNOSIS — M4316 Spondylolisthesis, lumbar region: Secondary | ICD-10-CM | POA: Diagnosis not present

## 2022-02-27 DIAGNOSIS — M5416 Radiculopathy, lumbar region: Secondary | ICD-10-CM | POA: Diagnosis not present

## 2022-02-27 DIAGNOSIS — Z1389 Encounter for screening for other disorder: Secondary | ICD-10-CM | POA: Diagnosis not present

## 2022-02-27 DIAGNOSIS — M48061 Spinal stenosis, lumbar region without neurogenic claudication: Secondary | ICD-10-CM | POA: Diagnosis not present

## 2022-02-27 DIAGNOSIS — M7062 Trochanteric bursitis, left hip: Secondary | ICD-10-CM | POA: Diagnosis not present

## 2022-02-27 DIAGNOSIS — G2581 Restless legs syndrome: Secondary | ICD-10-CM | POA: Diagnosis not present

## 2022-02-27 DIAGNOSIS — M461 Sacroiliitis, not elsewhere classified: Secondary | ICD-10-CM | POA: Diagnosis not present

## 2022-02-27 DIAGNOSIS — Z79891 Long term (current) use of opiate analgesic: Secondary | ICD-10-CM | POA: Diagnosis not present

## 2022-02-27 DIAGNOSIS — Z96652 Presence of left artificial knee joint: Secondary | ICD-10-CM | POA: Diagnosis not present

## 2022-02-27 DIAGNOSIS — M1612 Unilateral primary osteoarthritis, left hip: Secondary | ICD-10-CM | POA: Diagnosis not present

## 2022-02-27 DIAGNOSIS — Z96651 Presence of right artificial knee joint: Secondary | ICD-10-CM | POA: Diagnosis not present

## 2022-02-27 DIAGNOSIS — G894 Chronic pain syndrome: Secondary | ICD-10-CM | POA: Diagnosis not present

## 2022-03-13 DIAGNOSIS — R0600 Dyspnea, unspecified: Secondary | ICD-10-CM | POA: Diagnosis not present

## 2022-03-13 DIAGNOSIS — E559 Vitamin D deficiency, unspecified: Secondary | ICD-10-CM | POA: Diagnosis not present

## 2022-03-13 DIAGNOSIS — R918 Other nonspecific abnormal finding of lung field: Secondary | ICD-10-CM | POA: Diagnosis not present

## 2022-03-13 DIAGNOSIS — J45909 Unspecified asthma, uncomplicated: Secondary | ICD-10-CM | POA: Diagnosis not present

## 2022-03-13 DIAGNOSIS — M1612 Unilateral primary osteoarthritis, left hip: Secondary | ICD-10-CM | POA: Diagnosis not present

## 2022-03-13 DIAGNOSIS — M79609 Pain in unspecified limb: Secondary | ICD-10-CM | POA: Diagnosis not present

## 2022-03-13 DIAGNOSIS — Z01818 Encounter for other preprocedural examination: Secondary | ICD-10-CM | POA: Diagnosis not present

## 2022-03-13 DIAGNOSIS — Z79899 Other long term (current) drug therapy: Secondary | ICD-10-CM | POA: Diagnosis not present

## 2022-03-20 ENCOUNTER — Telehealth: Payer: Self-pay | Admitting: Cardiology

## 2022-03-20 ENCOUNTER — Telehealth: Payer: Self-pay

## 2022-03-20 NOTE — Telephone Encounter (Signed)
   [facility] Pre-operative Risk Assessment    Request for surgical clearance:  What type of surgery is being performed? Left Total Hip Arthroplasty    When is this surgery scheduled? TBD   What type of clearance is required (medical clearance vs. Pharmacy clearance to hold med vs. Both)? Both  Are there any medications that need to be held prior to surgery and how long?Not specified   Practice name and name of physician performing surgery? Dr. Creig Hines at [facility]    What is your office phone number:  [phone]   7.   What is your office fax number: [phone]  8.   Anesthesia type (None, local, MAC, general) ? General Anesthesia   Natasha Meyer 03/20/2022, 3:01 PM  _________________________________________________________________   (provider comments below) -

## 2022-03-20 NOTE — Telephone Encounter (Signed)
   Pre-operative Risk Assessment    Patient Name: Natasha Meyer  DOB: Nov 18, 1955 MRN: [id number]      Request for Surgical Clearance    Procedure:   Left hip replacement   Date of Surgery:  Clearance TBD                                 Surgeon:  Dr. Loralie Champagne  Surgeon's Group or Practice Name:  [facility]  Phone number:  [phone] [id number] Fax number:  [phone]   Type of Clearance Requested:   - Medical    Type of Anesthesia:  General    Additional requests/questions:    Alben Spittle   03/20/2022, 9:08 AM

## 2022-03-21 ENCOUNTER — Telehealth: Payer: Self-pay | Admitting: *Deleted

## 2022-03-21 NOTE — Telephone Encounter (Signed)
Pt has been scheduled for a tele visit 03/29/22 9:40.  Consent on file / medications reconciled.    Patient Consent for Virtual Visit        Natasha Meyer has provided verbal consent on 03/21/2022 for a virtual visit (video or telephone).   CONSENT FOR VIRTUAL VISIT FOR:  Natasha Meyer  By participating in this virtual visit I agree to the following:  I hereby voluntarily request, consent and authorize [facility] and its employed or contracted physicians, physician assistants, nurse practitioners or other licensed health care professionals (the Practitioner), to provide me with telemedicine health care services (the "Services") as deemed necessary by the treating Practitioner. I acknowledge and consent to receive the Services by the Practitioner via telemedicine. I understand that the telemedicine visit will involve communicating with the Practitioner through live audiovisual communication technology and the disclosure of certain medical information by electronic transmission. I acknowledge that I have been given the opportunity to request an in-person assessment or other available alternative prior to the telemedicine visit and am voluntarily participating in the telemedicine visit.  I understand that I have the right to withhold or withdraw my consent to the use of telemedicine in the course of my care at any time, without affecting my right to future care or treatment, and that the Practitioner or I may terminate the telemedicine visit at any time. I understand that I have the right to inspect all information obtained and/or recorded in the course of the telemedicine visit and may receive copies of available information for a reasonable fee.  I understand that some of the potential risks of receiving the Services via telemedicine include:  Delay or interruption in medical evaluation due to technological equipment failure or disruption; Information transmitted may not be sufficient (e.g.  poor resolution of images) to allow for appropriate medical decision making by the Practitioner; and/or  In rare instances, security protocols could fail, causing a breach of personal health information.  Furthermore, I acknowledge that it is my responsibility to provide information about my medical history, conditions and care that is complete and accurate to the best of my ability. I acknowledge that Practitioner's advice, recommendations, and/or decision may be based on factors not within their control, such as incomplete or inaccurate data provided by me or distortions of diagnostic images or specimens that may result from electronic transmissions. I understand that the practice of medicine is not an exact science and that Practitioner makes no warranties or guarantees regarding treatment outcomes. I acknowledge that a copy of this consent can be made available to me via my patient portal [facility] MyChart), or I can request a printed copy by calling the office of [facility].    I understand that my insurance will be billed for this visit.   I have read or had this consent read to me. I understand the contents of this consent, which adequately explains the benefits and risks of the Services being provided via telemedicine.  I have been provided ample opportunity to ask questions regarding this consent and the Services and have had my questions answered to my satisfaction. I give my informed consent for the services to be provided through the use of telemedicine in my medical care  \

## 2022-03-21 NOTE — Telephone Encounter (Signed)
   Name: Natasha Meyer  DOB: 05-15-1956  MRN: [id number]  Primary Cardiologist: None   Preoperative team, please contact this patient and set up a phone call appointment for further preoperative risk assessment. Please obtain consent and complete medication review. Thank you for your help.  I confirm that guidance regarding antiplatelet and oral anticoagulation therapy has been completed and, if necessary, noted below. -No request to hold meds  Tereso Newcomer, PA-C 03/21/2022, 1:00 PM [facility]

## 2022-03-21 NOTE — Telephone Encounter (Signed)
Pt has been scheduled for a tele visit, 03/29/22 9:40.

## 2022-03-27 DIAGNOSIS — Z96652 Presence of left artificial knee joint: Secondary | ICD-10-CM | POA: Diagnosis not present

## 2022-03-27 DIAGNOSIS — M461 Sacroiliitis, not elsewhere classified: Secondary | ICD-10-CM | POA: Diagnosis not present

## 2022-03-27 DIAGNOSIS — M48061 Spinal stenosis, lumbar region without neurogenic claudication: Secondary | ICD-10-CM | POA: Diagnosis not present

## 2022-03-27 DIAGNOSIS — Z79891 Long term (current) use of opiate analgesic: Secondary | ICD-10-CM | POA: Diagnosis not present

## 2022-03-27 DIAGNOSIS — G894 Chronic pain syndrome: Secondary | ICD-10-CM | POA: Diagnosis not present

## 2022-03-27 DIAGNOSIS — M4316 Spondylolisthesis, lumbar region: Secondary | ICD-10-CM | POA: Diagnosis not present

## 2022-03-27 DIAGNOSIS — M5416 Radiculopathy, lumbar region: Secondary | ICD-10-CM | POA: Diagnosis not present

## 2022-03-27 DIAGNOSIS — M7062 Trochanteric bursitis, left hip: Secondary | ICD-10-CM | POA: Diagnosis not present

## 2022-03-27 DIAGNOSIS — Z96651 Presence of right artificial knee joint: Secondary | ICD-10-CM | POA: Diagnosis not present

## 2022-03-28 NOTE — Progress Notes (Signed)
Virtual Visit via Telephone Note   Because of Natasha Meyer's co-morbid illnesses, she is at least at moderate risk for complications without adequate follow up.  This format is felt to be most appropriate for this patient at this time.  The patient did not have access to video technology/had technical difficulties with video requiring transitioning to audio format only (telephone).  All issues noted in this document were discussed and addressed.  No physical exam could be performed with this format.  Please refer to the patient's chart for her consent to telehealth for [facility]  Evaluation Performed:  Preoperative cardiovascular risk assessment _____________   Date:  03/28/2022   Patient ID:  Natasha Meyer, DOB 03-25-1956, MRN [id number] Patient Location:  Home Provider location:   Office  Primary Care Provider:  Marylen Ponto, MD Primary Cardiologist:  None  Chief Complaint / Patient Profile   66 y.o. y/o female with a h/o HTN, dyslipidemia, tobacco abuse, COPD on chronic O2 who is pending left total hip replacement and presents today for telephonic preoperative cardiovascular risk assessment.  Past Medical History    Past Medical History:  Diagnosis Date   Aches 11/30/2010   Formatting of this note might be different from the original. Widespread   Anemia    Arthritis    Asthma    Chronic hepatitis C virus infection (HCC) 05/07/2013   Cigarette nicotine dependence with nicotine-induced disorder 11/24/2020   Last Assessment & Plan:  Formatting of this note might be different from the original. I spent a total of 4 minutes with the patient counseling them on smoking cessation efforts.  She is aware of the medical complications of smoking including adverse events of lung cancer, emphysema, poor wound healing etc.  She has attempted to quit in the past.Barriers to quitting and avoidance of causes of rela   Depression    Diabetes mellitus type II, controlled (HCC)  01/30/2018   Fibromyalgia    GERD (gastroesophageal reflux disease)    Hepatitis    history of "C" ,took treatments no longer have it   Hyperlipidemia    Hypertension    Pain in joint, lower leg 11/30/2010   Plantar wart, left foot 01/30/2018   Pneumonia    history   Primary osteoarthritis of left knee 05/27/2015   S/P total knee replacement 12/24/2016   Sacroiliitis, not elsewhere classified (HCC) 09/01/2020   Last Assessment & Plan:  Formatting of this note might be different from the original. She does describe some nondescript SI joint pain worse on the left.  Some of her symptoms are atypical in her pain is very chronic.  She reportedly has received temporary and repeated relief with multiple SI joint injections in the past.  The SI joint injections been more useful to her the lumbar epidural steroi   Sleep apnea    Spondylolisthesis of lumbar region 11/24/2020   Last Assessment & Plan:  Formatting of this note might be different from the original. I personally reviewed the MRI of the lumbar spine as well as flexion and extension lumbar x-rays.  This demonstrates a stable degenerative spondylolisthesis at L4-5.  I do not see overt nerve root compression.  Her pain is isolated to the sacroiliac region and does not radiate and therefore I would not recommend   Thumb pain, left 07/05/2016   Past Surgical History:  Procedure Laterality Date   ABDOMINAL HYSTERECTOMY     CARPAL TUNNEL RELEASE Bilateral 1990's   CHOLECYSTECTOMY  HAND TENDON SURGERY Left    graft   JOINT REPLACEMENT Right 2009   knee   TOTAL KNEE ARTHROPLASTY Left 12/24/2016   Procedure: TOTAL KNEE ARTHROPLASTY;  Surgeon: Dannielle Huh, MD;  Location: [facility] OR;  Service: Orthopedics;  Laterality: Left;    Allergies  Allergies  Allergen Reactions   Hylan G-F 20 Swelling    SWELLING REACTION UNSPECIFIED    Glucosamine-Chondroitin Swelling    SWELLING REACTION UNSPECIFIED    Prednisone Hypertension   Codeine Other (See  Comments)    "Passed out" with tylenol #3 but has tolerated other forms of tylenol so allergy entered for codeine.   Lisinopril     SWELLING REACTION UNSPECIFIED    Sulfa Antibiotics     SWELLING REACTION UNSPECIFIED    Albuterol Other (See Comments)    INTOLERANCE > TACHYCARDIA   Latex Dermatitis and Rash   Morphine And Related Other (See Comments)    hallucinations    History of Present Illness    Natasha Meyer is a 66 y.o. female who presents via audio/video conferencing for a telehealth visit today.  Pt was last seen in cardiology clinic on 11/24/21 by Dr. Bing Matter.  At that time NOVEMBER SYPHER was doing well.  The patient is now pending procedure as outlined above. Since her last visit, she denies chest pain, lower extremity edema, fatigue, palpitations, melena, hematuria, hemoptysis, diaphoresis, weakness, presyncope, syncope, orthopnea, and PND. Has chronic dyspnea, on oxygen, feels that it is stable.   Home Medications    Prior to Admission medications   Medication Sig Start Date End Date Taking? Authorizing Provider  albuterol (VENTOLIN HFA) 108 (90 Base) MCG/ACT inhaler Inhale 2 puffs into the lungs every 6 (six) hours as needed for wheezing or shortness of breath.    [provider]  amLODipine-valsartan (EXFORGE) 5-160 MG tablet Take 1 tablet by mouth daily.    [provider]  aspirin EC 81 MG tablet Take 81 mg by mouth daily. Swallow whole.    [provider]  busPIRone (BUSPAR) 7.5 MG tablet Take 7.5 mg by mouth 2 (two) times daily.    [provider]  calcium-vitamin D 250-100 MG-UNIT tablet Take 1 tablet by mouth daily. 500 units    [provider]  cetirizine (ZYRTEC) 10 MG tablet Take 10 mg by mouth daily.    [provider]  diclofenac Sodium (VOLTAREN) 1 % GEL Apply 2 g topically as needed (pain).    [provider]  dicyclomine (BENTYL) 10 MG capsule Take 10 mg by mouth daily.    [provider]   DULoxetine (CYMBALTA) 60 MG capsule Take 60 mg by mouth daily.    [provider]  ferrous sulfate 325 (65 FE) MG EC tablet Take 325 mg by mouth daily with breakfast.    [provider]  fluticasone (FLONASE) 50 MCG/ACT nasal spray Place 2 sprays into both nostrils daily. 12/30/17   [provider]  Fluticasone-Umeclidin-Vilant (TRELEGY ELLIPTA) 200-62.5-25 MCG/ACT AEPB Inhale 1 puff into the lungs daily. 06/02/21   [provider]  levalbuterol Pauline Aus) 0.63 MG/3ML nebulizer solution Take 0.63 mg by nebulization every 4 (four) hours as needed for wheezing or shortness of breath.    [provider]  meloxicam (MOBIC) 7.5 MG tablet Take 7.5 mg by mouth in the morning and at bedtime.    [provider]  nortriptyline (PAMELOR) 25 MG capsule Take 25 mg by mouth at bedtime.    [provider]  Oxycodone HCl 10 MG TABS Take 10 mg by mouth every 5 (five) hours as needed (moderate pain).    [provider]  pantoprazole (PROTONIX) 40 MG tablet Take 40 mg by mouth 2 (two) times daily.    [provider]  pregabalin (LYRICA) 150 MG capsule Take 150 mg by mouth every 8 (eight) hours.    [provider]  promethazine (PHENERGAN) 25 MG tablet Take 25 mg by mouth every 12 (twelve) hours as needed for nausea or vomiting.    [provider]  rosuvastatin (CRESTOR) 20 MG tablet Take 20 mg by mouth daily. 12/31/17   [provider]  vitamin E 400 UNIT capsule Take 400 Units by mouth daily.    [provider]    Physical Exam    Vital Signs:  SHELBE AMBORN does not have vital signs available for review today.  Given telephonic nature of communication, physical exam is limited. AAOx3. NAD. Normal affect.  Speech and respirations are unlabored.  Accessory Clinical Findings    None  Assessment & Plan    1.  Preoperative Cardiovascular Risk Assessment:The patient is doing well from a cardiac  perspective. Therefore, based on ACC/AHA guidelines, the patient would be at acceptable risk for the planned procedure without further cardiovascular testing. The patient was advised that if he develops new symptoms prior to surgery to contact our office to arrange for a follow-up visit, and he verbalized understanding. According to the Revised Cardiac Risk Index (RCRI), her Perioperative Risk of Major Cardiac Event is (%): 0.9. Her Functional Capacity in METs is: 4.86 according to the [facility] Activity Status Index (DASI).  No request to hold medications  A copy of this note will be routed to requesting surgeon.  Time:   Today, I have spent 10 minutes with the patient with telehealth technology discussing medical history, symptoms, and management plan.     Emmaline Life, NP-C     03/28/2022, 11:08 AM [address] Office [phone] Fax [phone]

## 2022-03-29 ENCOUNTER — Ambulatory Visit: Payer: Medicare HMO | Attending: Interventional Cardiology | Admitting: Nurse Practitioner

## 2022-03-29 ENCOUNTER — Telehealth: Payer: Medicare HMO | Admitting: Physician Assistant

## 2022-03-29 ENCOUNTER — Encounter: Payer: Self-pay | Admitting: Nurse Practitioner

## 2022-03-29 DIAGNOSIS — M549 Dorsalgia, unspecified: Secondary | ICD-10-CM

## 2022-03-29 DIAGNOSIS — G894 Chronic pain syndrome: Secondary | ICD-10-CM

## 2022-03-29 DIAGNOSIS — Z01810 Encounter for preprocedural cardiovascular examination: Secondary | ICD-10-CM | POA: Diagnosis not present

## 2022-03-29 NOTE — Progress Notes (Signed)
Because of severity and duration of pain with symptoms of nerve compression, I feel your condition warrants further evaluation and I recommend that you be seen in a face to face visit. This is not something we can manage properly via e-visit. You need to call your PCP or be seen in person at nearest urgent care or ER.    NOTE: There will be NO CHARGE for this eVisit   If you are having a true medical emergency please call 911.      For an urgent face to face visit, [facility] has seven urgent care centers for your convenience:     [facility] at [facility] D[facility] [phone] [address] [place], [place] [address]    [facility]) Get Driving Directions [phone] [address] [place], [place] [address]  [facility]) Get Driving Directions [phone] [address] [place],  [place]  [address]  [facility] at [facility] Directions  [phone] [address] [place],  [place] [address]   [facility] Urgent Care at [facility] Get Driving Directions [phone] [address] [place], [place] [address]   [facility] Urgent Care at [facility] Get Driving Directions  [phone] [address] [place], [place] [address]   [facility] Health Urgent Care at [facility] Directions [phone] [address] [place], [place] [address]  Your MyChart E-visit questionnaire answers were reviewed by a board certified advanced clinical practitioner to complete your personal care plan based on your specific symptoms.  Thank you for using e-Visits.

## 2022-04-09 DIAGNOSIS — Z79899 Other long term (current) drug therapy: Secondary | ICD-10-CM | POA: Diagnosis not present

## 2022-04-09 DIAGNOSIS — Z471 Aftercare following joint replacement surgery: Secondary | ICD-10-CM | POA: Diagnosis not present

## 2022-04-09 DIAGNOSIS — F17210 Nicotine dependence, cigarettes, uncomplicated: Secondary | ICD-10-CM | POA: Diagnosis not present

## 2022-04-09 DIAGNOSIS — Z9889 Other specified postprocedural states: Secondary | ICD-10-CM | POA: Diagnosis not present

## 2022-04-09 DIAGNOSIS — J449 Chronic obstructive pulmonary disease, unspecified: Secondary | ICD-10-CM | POA: Diagnosis not present

## 2022-04-09 DIAGNOSIS — Z96642 Presence of left artificial hip joint: Secondary | ICD-10-CM | POA: Diagnosis not present

## 2022-04-09 DIAGNOSIS — R600 Localized edema: Secondary | ICD-10-CM | POA: Diagnosis not present

## 2022-04-09 DIAGNOSIS — M1612 Unilateral primary osteoarthritis, left hip: Secondary | ICD-10-CM | POA: Diagnosis not present

## 2022-04-10 DIAGNOSIS — M1612 Unilateral primary osteoarthritis, left hip: Secondary | ICD-10-CM | POA: Diagnosis not present

## 2022-04-10 DIAGNOSIS — Z79899 Other long term (current) drug therapy: Secondary | ICD-10-CM | POA: Diagnosis not present

## 2022-04-23 DIAGNOSIS — Z96651 Presence of right artificial knee joint: Secondary | ICD-10-CM | POA: Diagnosis not present

## 2022-04-23 DIAGNOSIS — Z96652 Presence of left artificial knee joint: Secondary | ICD-10-CM | POA: Diagnosis not present

## 2022-04-23 DIAGNOSIS — M461 Sacroiliitis, not elsewhere classified: Secondary | ICD-10-CM | POA: Diagnosis not present

## 2022-04-23 DIAGNOSIS — M48061 Spinal stenosis, lumbar region without neurogenic claudication: Secondary | ICD-10-CM | POA: Diagnosis not present

## 2022-04-23 DIAGNOSIS — M5416 Radiculopathy, lumbar region: Secondary | ICD-10-CM | POA: Diagnosis not present

## 2022-04-23 DIAGNOSIS — M4316 Spondylolisthesis, lumbar region: Secondary | ICD-10-CM | POA: Diagnosis not present

## 2022-04-23 DIAGNOSIS — G894 Chronic pain syndrome: Secondary | ICD-10-CM | POA: Diagnosis not present

## 2022-04-23 DIAGNOSIS — M7062 Trochanteric bursitis, left hip: Secondary | ICD-10-CM | POA: Diagnosis not present

## 2022-04-23 DIAGNOSIS — Z79891 Long term (current) use of opiate analgesic: Secondary | ICD-10-CM | POA: Diagnosis not present

## 2022-05-03 DIAGNOSIS — R1310 Dysphagia, unspecified: Secondary | ICD-10-CM | POA: Diagnosis not present

## 2022-05-03 DIAGNOSIS — E119 Type 2 diabetes mellitus without complications: Secondary | ICD-10-CM | POA: Diagnosis not present

## 2022-05-03 DIAGNOSIS — J4540 Moderate persistent asthma, uncomplicated: Secondary | ICD-10-CM | POA: Diagnosis not present

## 2022-05-03 DIAGNOSIS — J449 Chronic obstructive pulmonary disease, unspecified: Secondary | ICD-10-CM | POA: Diagnosis not present

## 2022-05-03 DIAGNOSIS — Z6828 Body mass index (BMI) 28.0-28.9, adult: Secondary | ICD-10-CM | POA: Diagnosis not present

## 2022-05-03 DIAGNOSIS — K219 Gastro-esophageal reflux disease without esophagitis: Secondary | ICD-10-CM | POA: Diagnosis not present

## 2022-05-08 DIAGNOSIS — R0602 Shortness of breath: Secondary | ICD-10-CM | POA: Diagnosis not present

## 2022-05-08 DIAGNOSIS — J4540 Moderate persistent asthma, uncomplicated: Secondary | ICD-10-CM | POA: Diagnosis not present

## 2022-05-23 DIAGNOSIS — G894 Chronic pain syndrome: Secondary | ICD-10-CM | POA: Diagnosis not present

## 2022-05-23 DIAGNOSIS — M5416 Radiculopathy, lumbar region: Secondary | ICD-10-CM | POA: Diagnosis not present

## 2022-05-23 DIAGNOSIS — M4316 Spondylolisthesis, lumbar region: Secondary | ICD-10-CM | POA: Diagnosis not present

## 2022-05-23 DIAGNOSIS — M48061 Spinal stenosis, lumbar region without neurogenic claudication: Secondary | ICD-10-CM | POA: Diagnosis not present

## 2022-05-23 DIAGNOSIS — Z79891 Long term (current) use of opiate analgesic: Secondary | ICD-10-CM | POA: Diagnosis not present

## 2022-05-23 DIAGNOSIS — M461 Sacroiliitis, not elsewhere classified: Secondary | ICD-10-CM | POA: Diagnosis not present

## 2022-05-23 DIAGNOSIS — Z96652 Presence of left artificial knee joint: Secondary | ICD-10-CM | POA: Diagnosis not present

## 2022-05-23 DIAGNOSIS — Z96651 Presence of right artificial knee joint: Secondary | ICD-10-CM | POA: Diagnosis not present

## 2022-05-23 DIAGNOSIS — M7062 Trochanteric bursitis, left hip: Secondary | ICD-10-CM | POA: Diagnosis not present

## 2022-05-29 DIAGNOSIS — J4540 Moderate persistent asthma, uncomplicated: Secondary | ICD-10-CM | POA: Diagnosis not present

## 2022-05-29 DIAGNOSIS — F17210 Nicotine dependence, cigarettes, uncomplicated: Secondary | ICD-10-CM | POA: Diagnosis not present

## 2022-05-29 DIAGNOSIS — E119 Type 2 diabetes mellitus without complications: Secondary | ICD-10-CM | POA: Diagnosis not present

## 2022-05-29 DIAGNOSIS — J301 Allergic rhinitis due to pollen: Secondary | ICD-10-CM | POA: Diagnosis not present

## 2022-05-29 DIAGNOSIS — J449 Chronic obstructive pulmonary disease, unspecified: Secondary | ICD-10-CM | POA: Diagnosis not present

## 2022-05-30 DIAGNOSIS — Z6828 Body mass index (BMI) 28.0-28.9, adult: Secondary | ICD-10-CM | POA: Diagnosis not present

## 2022-05-30 DIAGNOSIS — S40029A Contusion of unspecified upper arm, initial encounter: Secondary | ICD-10-CM | POA: Diagnosis not present

## 2022-06-07 DIAGNOSIS — M1612 Unilateral primary osteoarthritis, left hip: Secondary | ICD-10-CM | POA: Diagnosis not present

## 2022-06-07 DIAGNOSIS — Z96642 Presence of left artificial hip joint: Secondary | ICD-10-CM | POA: Diagnosis not present

## 2022-06-21 DIAGNOSIS — Z96651 Presence of right artificial knee joint: Secondary | ICD-10-CM | POA: Diagnosis not present

## 2022-06-21 DIAGNOSIS — M48061 Spinal stenosis, lumbar region without neurogenic claudication: Secondary | ICD-10-CM | POA: Diagnosis not present

## 2022-06-21 DIAGNOSIS — M4316 Spondylolisthesis, lumbar region: Secondary | ICD-10-CM | POA: Diagnosis not present

## 2022-06-21 DIAGNOSIS — Z96652 Presence of left artificial knee joint: Secondary | ICD-10-CM | POA: Diagnosis not present

## 2022-06-21 DIAGNOSIS — M461 Sacroiliitis, not elsewhere classified: Secondary | ICD-10-CM | POA: Diagnosis not present

## 2022-06-21 DIAGNOSIS — M5416 Radiculopathy, lumbar region: Secondary | ICD-10-CM | POA: Diagnosis not present

## 2022-06-21 DIAGNOSIS — M7062 Trochanteric bursitis, left hip: Secondary | ICD-10-CM | POA: Diagnosis not present

## 2022-06-21 DIAGNOSIS — G894 Chronic pain syndrome: Secondary | ICD-10-CM | POA: Diagnosis not present

## 2022-06-21 DIAGNOSIS — Z79891 Long term (current) use of opiate analgesic: Secondary | ICD-10-CM | POA: Diagnosis not present

## 2022-06-25 DIAGNOSIS — E119 Type 2 diabetes mellitus without complications: Secondary | ICD-10-CM | POA: Diagnosis not present

## 2022-06-25 DIAGNOSIS — J449 Chronic obstructive pulmonary disease, unspecified: Secondary | ICD-10-CM | POA: Diagnosis not present

## 2022-06-25 DIAGNOSIS — J4540 Moderate persistent asthma, uncomplicated: Secondary | ICD-10-CM | POA: Diagnosis not present

## 2022-07-25 DIAGNOSIS — B182 Chronic viral hepatitis C: Secondary | ICD-10-CM | POA: Diagnosis not present

## 2022-07-25 DIAGNOSIS — D649 Anemia, unspecified: Secondary | ICD-10-CM | POA: Diagnosis not present

## 2022-07-25 DIAGNOSIS — M4316 Spondylolisthesis, lumbar region: Secondary | ICD-10-CM | POA: Diagnosis not present

## 2022-07-25 DIAGNOSIS — K219 Gastro-esophageal reflux disease without esophagitis: Secondary | ICD-10-CM | POA: Diagnosis not present

## 2022-07-25 DIAGNOSIS — K5900 Constipation, unspecified: Secondary | ICD-10-CM | POA: Diagnosis not present

## 2022-07-25 DIAGNOSIS — M461 Sacroiliitis, not elsewhere classified: Secondary | ICD-10-CM | POA: Diagnosis not present

## 2022-07-25 DIAGNOSIS — R1310 Dysphagia, unspecified: Secondary | ICD-10-CM | POA: Diagnosis not present

## 2022-07-25 DIAGNOSIS — M7062 Trochanteric bursitis, left hip: Secondary | ICD-10-CM | POA: Diagnosis not present

## 2022-07-25 DIAGNOSIS — G894 Chronic pain syndrome: Secondary | ICD-10-CM | POA: Diagnosis not present

## 2022-07-25 DIAGNOSIS — M5416 Radiculopathy, lumbar region: Secondary | ICD-10-CM | POA: Diagnosis not present

## 2022-07-25 DIAGNOSIS — Z96652 Presence of left artificial knee joint: Secondary | ICD-10-CM | POA: Diagnosis not present

## 2022-07-25 DIAGNOSIS — M48061 Spinal stenosis, lumbar region without neurogenic claudication: Secondary | ICD-10-CM | POA: Diagnosis not present

## 2022-07-25 DIAGNOSIS — Z79891 Long term (current) use of opiate analgesic: Secondary | ICD-10-CM | POA: Diagnosis not present

## 2022-07-25 DIAGNOSIS — Z96651 Presence of right artificial knee joint: Secondary | ICD-10-CM | POA: Diagnosis not present

## 2022-07-26 DIAGNOSIS — D510 Vitamin B12 deficiency anemia due to intrinsic factor deficiency: Secondary | ICD-10-CM | POA: Diagnosis not present

## 2022-07-26 DIAGNOSIS — D649 Anemia, unspecified: Secondary | ICD-10-CM | POA: Diagnosis not present

## 2022-08-16 DIAGNOSIS — H903 Sensorineural hearing loss, bilateral: Secondary | ICD-10-CM | POA: Diagnosis not present

## 2022-08-21 DIAGNOSIS — H903 Sensorineural hearing loss, bilateral: Secondary | ICD-10-CM | POA: Diagnosis not present

## 2022-08-22 DIAGNOSIS — M5416 Radiculopathy, lumbar region: Secondary | ICD-10-CM | POA: Diagnosis not present

## 2022-08-22 DIAGNOSIS — Z96651 Presence of right artificial knee joint: Secondary | ICD-10-CM | POA: Diagnosis not present

## 2022-08-22 DIAGNOSIS — M7062 Trochanteric bursitis, left hip: Secondary | ICD-10-CM | POA: Diagnosis not present

## 2022-08-22 DIAGNOSIS — M7710 Lateral epicondylitis, unspecified elbow: Secondary | ICD-10-CM | POA: Diagnosis not present

## 2022-08-22 DIAGNOSIS — Z96652 Presence of left artificial knee joint: Secondary | ICD-10-CM | POA: Diagnosis not present

## 2022-08-22 DIAGNOSIS — M48061 Spinal stenosis, lumbar region without neurogenic claudication: Secondary | ICD-10-CM | POA: Diagnosis not present

## 2022-08-22 DIAGNOSIS — Z79891 Long term (current) use of opiate analgesic: Secondary | ICD-10-CM | POA: Diagnosis not present

## 2022-08-22 DIAGNOSIS — G894 Chronic pain syndrome: Secondary | ICD-10-CM | POA: Diagnosis not present

## 2022-08-22 DIAGNOSIS — M4316 Spondylolisthesis, lumbar region: Secondary | ICD-10-CM | POA: Diagnosis not present

## 2022-09-10 DIAGNOSIS — F17210 Nicotine dependence, cigarettes, uncomplicated: Secondary | ICD-10-CM | POA: Diagnosis not present

## 2022-09-10 DIAGNOSIS — J449 Chronic obstructive pulmonary disease, unspecified: Secondary | ICD-10-CM | POA: Diagnosis not present

## 2022-09-10 DIAGNOSIS — R918 Other nonspecific abnormal finding of lung field: Secondary | ICD-10-CM | POA: Diagnosis not present

## 2022-09-10 DIAGNOSIS — J301 Allergic rhinitis due to pollen: Secondary | ICD-10-CM | POA: Diagnosis not present

## 2022-09-11 DIAGNOSIS — M7700 Medial epicondylitis, unspecified elbow: Secondary | ICD-10-CM | POA: Diagnosis not present

## 2022-09-17 DIAGNOSIS — M7710 Lateral epicondylitis, unspecified elbow: Secondary | ICD-10-CM | POA: Diagnosis not present

## 2022-09-17 DIAGNOSIS — M5416 Radiculopathy, lumbar region: Secondary | ICD-10-CM | POA: Diagnosis not present

## 2022-09-17 DIAGNOSIS — Z79891 Long term (current) use of opiate analgesic: Secondary | ICD-10-CM | POA: Diagnosis not present

## 2022-09-17 DIAGNOSIS — M7062 Trochanteric bursitis, left hip: Secondary | ICD-10-CM | POA: Diagnosis not present

## 2022-09-17 DIAGNOSIS — M48061 Spinal stenosis, lumbar region without neurogenic claudication: Secondary | ICD-10-CM | POA: Diagnosis not present

## 2022-09-17 DIAGNOSIS — M4316 Spondylolisthesis, lumbar region: Secondary | ICD-10-CM | POA: Diagnosis not present

## 2022-09-17 DIAGNOSIS — Z96652 Presence of left artificial knee joint: Secondary | ICD-10-CM | POA: Diagnosis not present

## 2022-09-17 DIAGNOSIS — Z96651 Presence of right artificial knee joint: Secondary | ICD-10-CM | POA: Diagnosis not present

## 2022-09-17 DIAGNOSIS — G894 Chronic pain syndrome: Secondary | ICD-10-CM | POA: Diagnosis not present

## 2022-10-01 DIAGNOSIS — H524 Presbyopia: Secondary | ICD-10-CM | POA: Diagnosis not present

## 2022-10-01 DIAGNOSIS — E119 Type 2 diabetes mellitus without complications: Secondary | ICD-10-CM | POA: Diagnosis not present

## 2022-10-12 DIAGNOSIS — M4316 Spondylolisthesis, lumbar region: Secondary | ICD-10-CM | POA: Diagnosis not present

## 2022-10-12 DIAGNOSIS — Z96651 Presence of right artificial knee joint: Secondary | ICD-10-CM | POA: Diagnosis not present

## 2022-10-12 DIAGNOSIS — M48061 Spinal stenosis, lumbar region without neurogenic claudication: Secondary | ICD-10-CM | POA: Diagnosis not present

## 2022-10-12 DIAGNOSIS — G894 Chronic pain syndrome: Secondary | ICD-10-CM | POA: Diagnosis not present

## 2022-10-12 DIAGNOSIS — Z96652 Presence of left artificial knee joint: Secondary | ICD-10-CM | POA: Diagnosis not present

## 2022-10-12 DIAGNOSIS — Z79891 Long term (current) use of opiate analgesic: Secondary | ICD-10-CM | POA: Diagnosis not present

## 2022-10-12 DIAGNOSIS — M7062 Trochanteric bursitis, left hip: Secondary | ICD-10-CM | POA: Diagnosis not present

## 2022-10-12 DIAGNOSIS — M7710 Lateral epicondylitis, unspecified elbow: Secondary | ICD-10-CM | POA: Diagnosis not present

## 2022-10-12 DIAGNOSIS — M5416 Radiculopathy, lumbar region: Secondary | ICD-10-CM | POA: Diagnosis not present

## 2022-11-05 DIAGNOSIS — J449 Chronic obstructive pulmonary disease, unspecified: Secondary | ICD-10-CM | POA: Diagnosis not present

## 2022-11-05 DIAGNOSIS — J4540 Moderate persistent asthma, uncomplicated: Secondary | ICD-10-CM | POA: Diagnosis not present

## 2022-11-05 DIAGNOSIS — E119 Type 2 diabetes mellitus without complications: Secondary | ICD-10-CM | POA: Diagnosis not present

## 2022-11-12 DIAGNOSIS — M7062 Trochanteric bursitis, left hip: Secondary | ICD-10-CM | POA: Diagnosis not present

## 2022-11-12 DIAGNOSIS — M4316 Spondylolisthesis, lumbar region: Secondary | ICD-10-CM | POA: Diagnosis not present

## 2022-11-12 DIAGNOSIS — Z96651 Presence of right artificial knee joint: Secondary | ICD-10-CM | POA: Diagnosis not present

## 2022-11-12 DIAGNOSIS — Z79891 Long term (current) use of opiate analgesic: Secondary | ICD-10-CM | POA: Diagnosis not present

## 2022-11-12 DIAGNOSIS — M5416 Radiculopathy, lumbar region: Secondary | ICD-10-CM | POA: Diagnosis not present

## 2022-11-12 DIAGNOSIS — M48061 Spinal stenosis, lumbar region without neurogenic claudication: Secondary | ICD-10-CM | POA: Diagnosis not present

## 2022-11-12 DIAGNOSIS — Z96652 Presence of left artificial knee joint: Secondary | ICD-10-CM | POA: Diagnosis not present

## 2022-11-12 DIAGNOSIS — M7710 Lateral epicondylitis, unspecified elbow: Secondary | ICD-10-CM | POA: Diagnosis not present

## 2022-11-12 DIAGNOSIS — G894 Chronic pain syndrome: Secondary | ICD-10-CM | POA: Diagnosis not present

## 2022-12-04 DIAGNOSIS — M5416 Radiculopathy, lumbar region: Secondary | ICD-10-CM | POA: Diagnosis not present

## 2022-12-11 DIAGNOSIS — M4316 Spondylolisthesis, lumbar region: Secondary | ICD-10-CM | POA: Diagnosis not present

## 2022-12-11 DIAGNOSIS — M5416 Radiculopathy, lumbar region: Secondary | ICD-10-CM | POA: Diagnosis not present

## 2022-12-11 DIAGNOSIS — Z79891 Long term (current) use of opiate analgesic: Secondary | ICD-10-CM | POA: Diagnosis not present

## 2022-12-11 DIAGNOSIS — M7710 Lateral epicondylitis, unspecified elbow: Secondary | ICD-10-CM | POA: Diagnosis not present

## 2022-12-11 DIAGNOSIS — M7062 Trochanteric bursitis, left hip: Secondary | ICD-10-CM | POA: Diagnosis not present

## 2022-12-11 DIAGNOSIS — Z96652 Presence of left artificial knee joint: Secondary | ICD-10-CM | POA: Diagnosis not present

## 2022-12-11 DIAGNOSIS — Z96651 Presence of right artificial knee joint: Secondary | ICD-10-CM | POA: Diagnosis not present

## 2022-12-11 DIAGNOSIS — M48061 Spinal stenosis, lumbar region without neurogenic claudication: Secondary | ICD-10-CM | POA: Diagnosis not present

## 2022-12-11 DIAGNOSIS — G894 Chronic pain syndrome: Secondary | ICD-10-CM | POA: Diagnosis not present

## 2022-12-13 DIAGNOSIS — J4540 Moderate persistent asthma, uncomplicated: Secondary | ICD-10-CM | POA: Diagnosis not present

## 2022-12-13 DIAGNOSIS — E119 Type 2 diabetes mellitus without complications: Secondary | ICD-10-CM | POA: Diagnosis not present

## 2022-12-13 DIAGNOSIS — J449 Chronic obstructive pulmonary disease, unspecified: Secondary | ICD-10-CM | POA: Diagnosis not present

## 2023-01-01 DIAGNOSIS — J449 Chronic obstructive pulmonary disease, unspecified: Secondary | ICD-10-CM | POA: Diagnosis not present

## 2023-01-01 DIAGNOSIS — M7061 Trochanteric bursitis, right hip: Secondary | ICD-10-CM | POA: Diagnosis not present

## 2023-01-01 DIAGNOSIS — E119 Type 2 diabetes mellitus without complications: Secondary | ICD-10-CM | POA: Diagnosis not present

## 2023-01-01 DIAGNOSIS — J4540 Moderate persistent asthma, uncomplicated: Secondary | ICD-10-CM | POA: Diagnosis not present

## 2023-01-09 DIAGNOSIS — Z96651 Presence of right artificial knee joint: Secondary | ICD-10-CM | POA: Diagnosis not present

## 2023-01-09 DIAGNOSIS — G894 Chronic pain syndrome: Secondary | ICD-10-CM | POA: Diagnosis not present

## 2023-01-09 DIAGNOSIS — M48061 Spinal stenosis, lumbar region without neurogenic claudication: Secondary | ICD-10-CM | POA: Diagnosis not present

## 2023-01-09 DIAGNOSIS — Z79891 Long term (current) use of opiate analgesic: Secondary | ICD-10-CM | POA: Diagnosis not present

## 2023-01-09 DIAGNOSIS — M461 Sacroiliitis, not elsewhere classified: Secondary | ICD-10-CM | POA: Diagnosis not present

## 2023-01-09 DIAGNOSIS — M4316 Spondylolisthesis, lumbar region: Secondary | ICD-10-CM | POA: Diagnosis not present

## 2023-01-09 DIAGNOSIS — M5416 Radiculopathy, lumbar region: Secondary | ICD-10-CM | POA: Diagnosis not present

## 2023-01-09 DIAGNOSIS — Z96652 Presence of left artificial knee joint: Secondary | ICD-10-CM | POA: Diagnosis not present

## 2023-01-09 DIAGNOSIS — M7710 Lateral epicondylitis, unspecified elbow: Secondary | ICD-10-CM | POA: Diagnosis not present

## 2023-01-15 DIAGNOSIS — F17210 Nicotine dependence, cigarettes, uncomplicated: Secondary | ICD-10-CM | POA: Diagnosis not present

## 2023-01-15 DIAGNOSIS — J301 Allergic rhinitis due to pollen: Secondary | ICD-10-CM | POA: Diagnosis not present

## 2023-01-15 DIAGNOSIS — R918 Other nonspecific abnormal finding of lung field: Secondary | ICD-10-CM | POA: Diagnosis not present

## 2023-01-15 DIAGNOSIS — J449 Chronic obstructive pulmonary disease, unspecified: Secondary | ICD-10-CM | POA: Diagnosis not present

## 2023-01-23 DIAGNOSIS — R252 Cramp and spasm: Secondary | ICD-10-CM | POA: Diagnosis not present

## 2023-01-23 DIAGNOSIS — Z1331 Encounter for screening for depression: Secondary | ICD-10-CM | POA: Diagnosis not present

## 2023-01-23 DIAGNOSIS — Z6826 Body mass index (BMI) 26.0-26.9, adult: Secondary | ICD-10-CM | POA: Diagnosis not present

## 2023-01-23 DIAGNOSIS — K219 Gastro-esophageal reflux disease without esophagitis: Secondary | ICD-10-CM | POA: Diagnosis not present

## 2023-01-23 DIAGNOSIS — R296 Repeated falls: Secondary | ICD-10-CM | POA: Diagnosis not present

## 2023-01-23 DIAGNOSIS — I10 Essential (primary) hypertension: Secondary | ICD-10-CM | POA: Diagnosis not present

## 2023-01-23 DIAGNOSIS — Z0000 Encounter for general adult medical examination without abnormal findings: Secondary | ICD-10-CM | POA: Diagnosis not present

## 2023-01-23 DIAGNOSIS — F410 Panic disorder [episodic paroxysmal anxiety] without agoraphobia: Secondary | ICD-10-CM | POA: Diagnosis not present

## 2023-01-23 DIAGNOSIS — Z1339 Encounter for screening examination for other mental health and behavioral disorders: Secondary | ICD-10-CM | POA: Diagnosis not present

## 2023-01-23 DIAGNOSIS — Z79899 Other long term (current) drug therapy: Secondary | ICD-10-CM | POA: Diagnosis not present

## 2023-01-23 DIAGNOSIS — E119 Type 2 diabetes mellitus without complications: Secondary | ICD-10-CM | POA: Diagnosis not present

## 2023-01-23 DIAGNOSIS — J302 Other seasonal allergic rhinitis: Secondary | ICD-10-CM | POA: Diagnosis not present

## 2023-01-30 DIAGNOSIS — E119 Type 2 diabetes mellitus without complications: Secondary | ICD-10-CM | POA: Diagnosis not present

## 2023-01-30 DIAGNOSIS — J4540 Moderate persistent asthma, uncomplicated: Secondary | ICD-10-CM | POA: Diagnosis not present

## 2023-01-30 DIAGNOSIS — J449 Chronic obstructive pulmonary disease, unspecified: Secondary | ICD-10-CM | POA: Diagnosis not present

## 2023-02-05 DIAGNOSIS — Z1231 Encounter for screening mammogram for malignant neoplasm of breast: Secondary | ICD-10-CM | POA: Diagnosis not present

## 2023-02-07 DIAGNOSIS — Z96652 Presence of left artificial knee joint: Secondary | ICD-10-CM | POA: Diagnosis not present

## 2023-02-07 DIAGNOSIS — Z79891 Long term (current) use of opiate analgesic: Secondary | ICD-10-CM | POA: Diagnosis not present

## 2023-02-07 DIAGNOSIS — M48061 Spinal stenosis, lumbar region without neurogenic claudication: Secondary | ICD-10-CM | POA: Diagnosis not present

## 2023-02-07 DIAGNOSIS — M5416 Radiculopathy, lumbar region: Secondary | ICD-10-CM | POA: Diagnosis not present

## 2023-02-07 DIAGNOSIS — M461 Sacroiliitis, not elsewhere classified: Secondary | ICD-10-CM | POA: Diagnosis not present

## 2023-02-07 DIAGNOSIS — M7062 Trochanteric bursitis, left hip: Secondary | ICD-10-CM | POA: Diagnosis not present

## 2023-02-07 DIAGNOSIS — G894 Chronic pain syndrome: Secondary | ICD-10-CM | POA: Diagnosis not present

## 2023-02-07 DIAGNOSIS — M5136 Other intervertebral disc degeneration, lumbar region: Secondary | ICD-10-CM | POA: Diagnosis not present

## 2023-02-07 DIAGNOSIS — M4316 Spondylolisthesis, lumbar region: Secondary | ICD-10-CM | POA: Diagnosis not present

## 2023-02-07 DIAGNOSIS — G2581 Restless legs syndrome: Secondary | ICD-10-CM | POA: Diagnosis not present

## 2023-02-07 DIAGNOSIS — Z96651 Presence of right artificial knee joint: Secondary | ICD-10-CM | POA: Diagnosis not present

## 2023-02-12 DIAGNOSIS — M545 Low back pain, unspecified: Secondary | ICD-10-CM | POA: Diagnosis not present

## 2023-02-12 DIAGNOSIS — M4316 Spondylolisthesis, lumbar region: Secondary | ICD-10-CM | POA: Diagnosis not present

## 2023-02-12 DIAGNOSIS — I700 Atherosclerosis of aorta: Secondary | ICD-10-CM | POA: Diagnosis not present

## 2023-02-19 DIAGNOSIS — F17200 Nicotine dependence, unspecified, uncomplicated: Secondary | ICD-10-CM | POA: Diagnosis not present

## 2023-02-19 DIAGNOSIS — R438 Other disturbances of smell and taste: Secondary | ICD-10-CM | POA: Diagnosis not present

## 2023-02-19 DIAGNOSIS — J342 Deviated nasal septum: Secondary | ICD-10-CM | POA: Diagnosis not present

## 2023-03-05 DIAGNOSIS — Z122 Encounter for screening for malignant neoplasm of respiratory organs: Secondary | ICD-10-CM | POA: Diagnosis not present

## 2023-03-05 DIAGNOSIS — F17210 Nicotine dependence, cigarettes, uncomplicated: Secondary | ICD-10-CM | POA: Diagnosis not present

## 2023-03-05 DIAGNOSIS — J439 Emphysema, unspecified: Secondary | ICD-10-CM | POA: Diagnosis not present

## 2023-03-05 DIAGNOSIS — I700 Atherosclerosis of aorta: Secondary | ICD-10-CM | POA: Diagnosis not present

## 2023-03-05 DIAGNOSIS — I2510 Atherosclerotic heart disease of native coronary artery without angina pectoris: Secondary | ICD-10-CM | POA: Diagnosis not present

## 2023-03-06 DIAGNOSIS — R519 Headache, unspecified: Secondary | ICD-10-CM | POA: Diagnosis not present

## 2023-03-06 DIAGNOSIS — R438 Other disturbances of smell and taste: Secondary | ICD-10-CM | POA: Diagnosis not present

## 2023-03-06 DIAGNOSIS — Z8673 Personal history of transient ischemic attack (TIA), and cerebral infarction without residual deficits: Secondary | ICD-10-CM | POA: Diagnosis not present

## 2023-03-13 DIAGNOSIS — R918 Other nonspecific abnormal finding of lung field: Secondary | ICD-10-CM | POA: Diagnosis not present

## 2023-03-13 DIAGNOSIS — J449 Chronic obstructive pulmonary disease, unspecified: Secondary | ICD-10-CM | POA: Diagnosis not present

## 2023-03-13 DIAGNOSIS — R430 Anosmia: Secondary | ICD-10-CM | POA: Diagnosis not present

## 2023-03-13 DIAGNOSIS — I2510 Atherosclerotic heart disease of native coronary artery without angina pectoris: Secondary | ICD-10-CM | POA: Diagnosis not present

## 2023-03-13 DIAGNOSIS — F17210 Nicotine dependence, cigarettes, uncomplicated: Secondary | ICD-10-CM | POA: Diagnosis not present

## 2023-03-13 DIAGNOSIS — J301 Allergic rhinitis due to pollen: Secondary | ICD-10-CM | POA: Diagnosis not present

## 2023-03-14 DIAGNOSIS — M48061 Spinal stenosis, lumbar region without neurogenic claudication: Secondary | ICD-10-CM | POA: Diagnosis not present

## 2023-03-14 DIAGNOSIS — Z96652 Presence of left artificial knee joint: Secondary | ICD-10-CM | POA: Diagnosis not present

## 2023-03-14 DIAGNOSIS — M4310 Spondylolisthesis, site unspecified: Secondary | ICD-10-CM | POA: Diagnosis not present

## 2023-03-14 DIAGNOSIS — G894 Chronic pain syndrome: Secondary | ICD-10-CM | POA: Diagnosis not present

## 2023-03-14 DIAGNOSIS — Z96651 Presence of right artificial knee joint: Secondary | ICD-10-CM | POA: Diagnosis not present

## 2023-03-14 DIAGNOSIS — Z79891 Long term (current) use of opiate analgesic: Secondary | ICD-10-CM | POA: Diagnosis not present

## 2023-03-14 DIAGNOSIS — M7062 Trochanteric bursitis, left hip: Secondary | ICD-10-CM | POA: Diagnosis not present

## 2023-03-14 DIAGNOSIS — M5416 Radiculopathy, lumbar region: Secondary | ICD-10-CM | POA: Diagnosis not present

## 2023-03-14 DIAGNOSIS — M4316 Spondylolisthesis, lumbar region: Secondary | ICD-10-CM | POA: Diagnosis not present

## 2023-03-20 DIAGNOSIS — G5620 Lesion of ulnar nerve, unspecified upper limb: Secondary | ICD-10-CM | POA: Diagnosis not present

## 2023-03-26 DIAGNOSIS — R931 Abnormal findings on diagnostic imaging of heart and coronary circulation: Secondary | ICD-10-CM | POA: Diagnosis not present

## 2023-03-26 DIAGNOSIS — I2510 Atherosclerotic heart disease of native coronary artery without angina pectoris: Secondary | ICD-10-CM | POA: Diagnosis not present

## 2023-04-02 DIAGNOSIS — M47816 Spondylosis without myelopathy or radiculopathy, lumbar region: Secondary | ICD-10-CM | POA: Diagnosis not present

## 2023-04-03 DIAGNOSIS — J449 Chronic obstructive pulmonary disease, unspecified: Secondary | ICD-10-CM | POA: Diagnosis not present

## 2023-04-03 DIAGNOSIS — I2510 Atherosclerotic heart disease of native coronary artery without angina pectoris: Secondary | ICD-10-CM | POA: Diagnosis not present

## 2023-04-04 DIAGNOSIS — J342 Deviated nasal septum: Secondary | ICD-10-CM | POA: Diagnosis not present

## 2023-04-04 DIAGNOSIS — R0981 Nasal congestion: Secondary | ICD-10-CM | POA: Diagnosis not present

## 2023-04-10 DIAGNOSIS — I2510 Atherosclerotic heart disease of native coronary artery without angina pectoris: Secondary | ICD-10-CM | POA: Diagnosis not present

## 2023-04-10 DIAGNOSIS — F17210 Nicotine dependence, cigarettes, uncomplicated: Secondary | ICD-10-CM | POA: Diagnosis not present

## 2023-04-10 DIAGNOSIS — J449 Chronic obstructive pulmonary disease, unspecified: Secondary | ICD-10-CM | POA: Diagnosis not present

## 2023-04-10 DIAGNOSIS — J301 Allergic rhinitis due to pollen: Secondary | ICD-10-CM | POA: Diagnosis not present

## 2023-04-11 DIAGNOSIS — Z79891 Long term (current) use of opiate analgesic: Secondary | ICD-10-CM | POA: Diagnosis not present

## 2023-04-11 DIAGNOSIS — Z96652 Presence of left artificial knee joint: Secondary | ICD-10-CM | POA: Diagnosis not present

## 2023-04-11 DIAGNOSIS — M7062 Trochanteric bursitis, left hip: Secondary | ICD-10-CM | POA: Diagnosis not present

## 2023-04-11 DIAGNOSIS — M48061 Spinal stenosis, lumbar region without neurogenic claudication: Secondary | ICD-10-CM | POA: Diagnosis not present

## 2023-04-11 DIAGNOSIS — M4316 Spondylolisthesis, lumbar region: Secondary | ICD-10-CM | POA: Diagnosis not present

## 2023-04-11 DIAGNOSIS — M4310 Spondylolisthesis, site unspecified: Secondary | ICD-10-CM | POA: Diagnosis not present

## 2023-04-11 DIAGNOSIS — M5416 Radiculopathy, lumbar region: Secondary | ICD-10-CM | POA: Diagnosis not present

## 2023-04-11 DIAGNOSIS — Z96651 Presence of right artificial knee joint: Secondary | ICD-10-CM | POA: Diagnosis not present

## 2023-04-11 DIAGNOSIS — G894 Chronic pain syndrome: Secondary | ICD-10-CM | POA: Diagnosis not present

## 2023-04-22 DIAGNOSIS — Z6826 Body mass index (BMI) 26.0-26.9, adult: Secondary | ICD-10-CM | POA: Diagnosis not present

## 2023-04-22 DIAGNOSIS — R55 Syncope and collapse: Secondary | ICD-10-CM | POA: Diagnosis not present

## 2023-04-24 DIAGNOSIS — R55 Syncope and collapse: Secondary | ICD-10-CM | POA: Diagnosis not present

## 2023-04-24 DIAGNOSIS — D649 Anemia, unspecified: Secondary | ICD-10-CM | POA: Diagnosis not present

## 2023-04-25 DIAGNOSIS — D649 Anemia, unspecified: Secondary | ICD-10-CM | POA: Diagnosis not present

## 2023-04-25 DIAGNOSIS — R55 Syncope and collapse: Secondary | ICD-10-CM | POA: Diagnosis not present

## 2023-04-30 DIAGNOSIS — M7062 Trochanteric bursitis, left hip: Secondary | ICD-10-CM | POA: Diagnosis not present

## 2023-05-01 ENCOUNTER — Telehealth: Payer: Self-pay | Admitting: Cardiology

## 2023-05-01 NOTE — Telephone Encounter (Signed)
Tried to call patient back. No answer and no access code to dial.

## 2023-05-01 NOTE — Telephone Encounter (Signed)
Patient is calling to talk with Dr. Bing Matter or nurse about the low dose aspirin. Please call back

## 2023-05-02 NOTE — Telephone Encounter (Signed)
No answer or v/m 

## 2023-05-03 NOTE — Telephone Encounter (Signed)
Attempted to call the patient. Patient did not answer and no voice mail to leave a message.

## 2023-05-06 NOTE — Telephone Encounter (Signed)
Attempted to call the patient. Patient did not answer and no voice mail to leave a message.

## 2023-05-07 ENCOUNTER — Telehealth: Payer: Self-pay

## 2023-05-07 NOTE — Telephone Encounter (Signed)
Pt called stating that she was going to stop her Aspirin because her arms was bruising.

## 2023-05-08 DIAGNOSIS — J301 Allergic rhinitis due to pollen: Secondary | ICD-10-CM | POA: Diagnosis not present

## 2023-05-08 DIAGNOSIS — R430 Anosmia: Secondary | ICD-10-CM | POA: Diagnosis not present

## 2023-05-08 DIAGNOSIS — R918 Other nonspecific abnormal finding of lung field: Secondary | ICD-10-CM | POA: Diagnosis not present

## 2023-05-08 DIAGNOSIS — F17210 Nicotine dependence, cigarettes, uncomplicated: Secondary | ICD-10-CM | POA: Diagnosis not present

## 2023-05-08 DIAGNOSIS — J449 Chronic obstructive pulmonary disease, unspecified: Secondary | ICD-10-CM | POA: Diagnosis not present

## 2023-05-10 DIAGNOSIS — R55 Syncope and collapse: Secondary | ICD-10-CM | POA: Diagnosis not present

## 2023-05-16 DIAGNOSIS — M48061 Spinal stenosis, lumbar region without neurogenic claudication: Secondary | ICD-10-CM | POA: Diagnosis not present

## 2023-05-16 DIAGNOSIS — G894 Chronic pain syndrome: Secondary | ICD-10-CM | POA: Diagnosis not present

## 2023-05-16 DIAGNOSIS — M4310 Spondylolisthesis, site unspecified: Secondary | ICD-10-CM | POA: Diagnosis not present

## 2023-05-16 DIAGNOSIS — Z96652 Presence of left artificial knee joint: Secondary | ICD-10-CM | POA: Diagnosis not present

## 2023-05-16 DIAGNOSIS — M5416 Radiculopathy, lumbar region: Secondary | ICD-10-CM | POA: Diagnosis not present

## 2023-05-16 DIAGNOSIS — Z79891 Long term (current) use of opiate analgesic: Secondary | ICD-10-CM | POA: Diagnosis not present

## 2023-05-16 DIAGNOSIS — Z96651 Presence of right artificial knee joint: Secondary | ICD-10-CM | POA: Diagnosis not present

## 2023-05-16 DIAGNOSIS — M7062 Trochanteric bursitis, left hip: Secondary | ICD-10-CM | POA: Diagnosis not present

## 2023-05-16 DIAGNOSIS — M4316 Spondylolisthesis, lumbar region: Secondary | ICD-10-CM | POA: Diagnosis not present

## 2023-05-21 DIAGNOSIS — Z23 Encounter for immunization: Secondary | ICD-10-CM | POA: Diagnosis not present

## 2023-05-24 ENCOUNTER — Encounter: Payer: Self-pay | Admitting: Cardiology

## 2023-05-24 ENCOUNTER — Ambulatory Visit: Payer: Medicare HMO | Attending: Cardiology | Admitting: Cardiology

## 2023-05-24 VITALS — BP 135/75 | HR 82 | Ht <= 58 in | Wt 125.0 lb

## 2023-05-24 DIAGNOSIS — I471 Supraventricular tachycardia, unspecified: Secondary | ICD-10-CM | POA: Diagnosis not present

## 2023-05-24 DIAGNOSIS — J410 Simple chronic bronchitis: Secondary | ICD-10-CM

## 2023-05-24 DIAGNOSIS — I10 Essential (primary) hypertension: Secondary | ICD-10-CM

## 2023-05-24 DIAGNOSIS — I4729 Other ventricular tachycardia: Secondary | ICD-10-CM

## 2023-05-24 DIAGNOSIS — F17219 Nicotine dependence, cigarettes, with unspecified nicotine-induced disorders: Secondary | ICD-10-CM

## 2023-05-24 DIAGNOSIS — E785 Hyperlipidemia, unspecified: Secondary | ICD-10-CM

## 2023-05-24 DIAGNOSIS — R55 Syncope and collapse: Secondary | ICD-10-CM

## 2023-05-24 NOTE — Patient Instructions (Signed)
Medication Instructions:  Your physician recommends that you continue on your current medications as directed. Please refer to the Current Medication list given to you today.  *If you need a refill on your cardiac medications before your next appointment, please call your pharmacy*   Lab Work: NONE If you have labs (blood work) drawn today and your tests are completely normal, you will receive your results only by: MyChart Message (if you have MyChart) OR A paper copy in the mail If you have any lab test that is abnormal or we need to change your treatment, we will call you to review the results.   Testing/Procedures: Your physician has requested that you have an echocardiogram. Echocardiography is a painless test that uses sound waves to create images of your heart. It provides your doctor with information about the size and shape of your heart and how well your heart's chambers and valves are working. This procedure takes approximately one hour. There are no restrictions for this procedure. Please do NOT wear cologne, perfume, aftershave, or lotions (deodorant is allowed). Please arrive 15 minutes prior to your appointment time.  Please note: We ask at that you not bring children with you during ultrasound (echo/ vascular) testing. Due to room size and safety concerns, children are not allowed in the ultrasound rooms during exams. Our front office staff cannot provide observation of children in our lobby area while testing is being conducted. An adult accompanying a patient to their appointment will only be allowed in the ultrasound room at the discretion of the ultrasound technician under special circumstances. We apologize for any inconvenience.    Follow-Up: At [facility], you and your health needs are our priority.  As part of our continuing mission to provide you with exceptional heart care, we have created designated Provider Care Teams.  These Care Teams include your  primary Cardiologist (physician) and Advanced Practice Providers (APPs -  Physician Assistants and Nurse Practitioners) who all work together to provide you with the care you need, when you need it.  We recommend signing up for the patient portal called "MyChart".  Sign up information is provided on this After Visit Summary.  MyChart is used to connect with patients for Virtual Visits (Telemedicine).  Patients are able to view lab/test results, encounter notes, upcoming appointments, etc.  Non-urgent messages can be sent to your provider as well.   To learn more about what you can do with MyChart, go to [email or web address].    Your next appointment:   6 month(s)  Provider:   Gypsy Balsam, MD    Other Instructions

## 2023-05-24 NOTE — Progress Notes (Signed)
Cardiology Office Note:  .   Date:  05/24/2023  ID:  Natasha Meyer, DOB [birth year], MRN [id number] PCP: Marylen Ponto, MD  [facility] Providers Cardiologist:  Gypsy Balsam, MD    History of Present Illness: .   Natasha Meyer is a 67 y.o. female with a past medical history of hypertension, COPD, GERD, tobacco abuse, DM2, fibromyalgia, dyslipidemia.  03/26/2023 Lexiscan normal, low risk study 01/19/2021 echo EF 50-55%, moderate LVH, grade I DD 01/04/2021 lexiscan normal, low restudy  Most recently evaluated by Dr. Bing Matter on 11/24/2021, her blood pressure was well-controlled.  She was most bothered by her advanced COPD and was working on stopping smoking.  Has been evaluated by her PCP, they arranged to monitor which revealed an average heart rate of 91 bpm, predominant rhythm was sinus, 1 run of VT occurred for 6 beats, 2 episodes of SVT, SVE's were occasional at 3.5%.  She presents today for an abnormal monitor that was arranged by her PCP.  She apparently had 3 syncopal episodes in the time span of about 15 minutes which prompted the monitor.  She is a poor historian and it is relatively difficult to understand the sequence of events.  She states after she passed out she was taken to the emergency department and transfused with blood.  She then wore the monitor which was largely normal except for 1 run of VT for 6 beats and 2 episodes of SVT, SVE's occasional at 3.5%.  She had had a Lexiscan on 03/26/2023 which was normal, negative for ischemia.  She offers no formal complaints today.  She is trying to stay well-hydrated.  She is trying to stop smoking, smoking approximately 12 cigarettes/day. She denies chest pain, palpitations, dyspnea, pnd, orthopnea, n, v, dizziness, syncope, edema, weight gain, or early satiety.   ROS: Review of Systems  Respiratory:  Positive for shortness of breath (Baseline).   Cardiovascular:  Positive for palpitations.  Endo/Heme/Allergies:  Bruises/bleeds  easily.  All other systems reviewed and are negative.   Studies Reviewed: Marland Kitchen   EKG Interpretation Date/Time:  Friday May 24 2023 14:38:17 EDT Ventricular Rate:  82 PR Interval:  150 QRS Duration:  74 QT Interval:  350 QTC Calculation: 408 R Axis:   32  Text Interpretation: Normal sinus rhythm Normal ECG When compared with ECG of 10-Nov-2007 12:19, QT has shortened Confirmed by Wallis Bamberg [phone]) on 05/24/2023 2:42:34 PM    Cardiac Studies & Procedures     STRESS TESTS  MYOCARDIAL PERFUSION IMAGING 01/04/2021  Interpretation Summary  The left ventricular ejection fraction is mildly decreased (45-54%).  Nuclear stress EF: 54%.  There was no ST segment deviation noted during stress.  The study is normal.  This is a low risk study.   ECHOCARDIOGRAM  ECHOCARDIOGRAM COMPLETE 01/19/2021  Narrative ECHOCARDIOGRAM REPORT    Patient Name:   Natasha Meyer Date of Exam: 01/19/2021 Medical Rec #:  [id number]     Height:       58.0 in Accession #:    [id number]    Weight:       138.0 lb Date of Birth:  Jan 14, 1956      BSA:          1.555 m Patient Age:    65 years      BP:           130/82 mmHg Patient Gender: F             HR:  79 bpm. Exam Location:    Procedure: 2D Echo  Indications:    Preop cardiovascular exam [Z01.810 (ICD-10-CM)]; Essential hypertension [I10 (ICD-10-CM)]; Controlled type 2 diabetes mellitus without complication, without long-term current use of insulin (HCC) [E11.9 (ICD-10-CM)]; Dyslipidemia [E78.5 (ICD-10-CM)]; Cigarette nicotine dependence with nicotine-induced disorder [F17.219 (ICD-10-CM)]; Simple chronic bronchitis (HCC) [J41.0 (ICD-10-CM)]  History:        Patient has no prior history of Echocardiogram examinations.  Sonographer:    Louie Boston Referring Phys: [phone] ROBERT J KRASOWSKI  IMPRESSIONS   1. Left ventricular ejection fraction, by estimation, is 50 to 55%. The left ventricle has low normal function. The  left ventricle has no regional wall motion abnormalities. There is moderate concentric left ventricular hypertrophy. Left ventricular diastolic parameters are consistent with Grade I diastolic dysfunction (impaired relaxation). The average left ventricular global longitudinal strain is -8.2 %. The global longitudinal strain is abnormal. 2. Right ventricular systolic function is normal. The right ventricular size is normal. There is normal pulmonary artery systolic pressure. 3. The mitral valve is normal in structure. No evidence of mitral valve regurgitation. No evidence of mitral stenosis. 4. The aortic valve is normal in structure. Aortic valve regurgitation is not visualized. No aortic stenosis is present. 5. The inferior vena cava is normal in size with greater than 50% respiratory variability, suggesting right atrial pressure of 3 mmHg.  FINDINGS Left Ventricle: Left ventricular ejection fraction, by estimation, is 50 to 55%. The left ventricle has low normal function. The left ventricle has no regional wall motion abnormalities. The average left ventricular global longitudinal strain is -8.2 %. The global longitudinal strain is abnormal. The left ventricular internal cavity size was normal in size. There is moderate concentric left ventricular hypertrophy. Left ventricular diastolic parameters are consistent with Grade I diastolic dysfunction (impaired relaxation). Indeterminate filling pressures.  Right Ventricle: The right ventricular size is normal. No increase in right ventricular wall thickness. Right ventricular systolic function is normal. There is normal pulmonary artery systolic pressure. The tricuspid regurgitant velocity is 1.81 m/s, and with an assumed right atrial pressure of 3 mmHg, the estimated right ventricular systolic pressure is 16.1 mmHg.  Left Atrium: Left atrial size was normal in size.  Right Atrium: Right atrial size was normal in size.  Pericardium: There is no  evidence of pericardial effusion.  Mitral Valve: The mitral valve is normal in structure. No evidence of mitral valve regurgitation. No evidence of mitral valve stenosis.  Tricuspid Valve: The tricuspid valve is normal in structure. Tricuspid valve regurgitation is trivial. No evidence of tricuspid stenosis.  Aortic Valve: The aortic valve is normal in structure. Aortic valve regurgitation is not visualized. No aortic stenosis is present.  Pulmonic Valve: The pulmonic valve was normal in structure. Pulmonic valve regurgitation is not visualized. No evidence of pulmonic stenosis.  Aorta: The aortic root and ascending aorta are structurally normal, with no evidence of dilitation and the aortic arch was not well visualized.  Venous: The pulmonary veins were not well visualized. The inferior vena cava is normal in size with greater than 50% respiratory variability, suggesting right atrial pressure of 3 mmHg.  IAS/Shunts: No atrial level shunt detected by color flow Doppler.   LEFT VENTRICLE PLAX 2D LVIDd:         3.20 cm     Diastology LVIDs:         2.50 cm     LV e' medial:    4.90 cm/s LV PW:  1.40 cm     LV E/e' medial:  15.4 LV IVS:        1.40 cm     LV e' lateral:   7.40 cm/s LVOT diam:     1.90 cm     LV E/e' lateral: 10.2 LV SV:         43 LV SV Index:   28          2D Longitudinal Strain LVOT Area:     2.84 cm    2D Strain GLS Avg:     -8.2 %  LV Volumes (MOD) LV vol d, MOD A2C: 50.3 ml LV vol d, MOD A4C: 52.1 ml LV vol s, MOD A2C: 29.4 ml LV vol s, MOD A4C: 25.1 ml LV SV MOD A2C:     20.9 ml LV SV MOD A4C:     52.1 ml LV SV MOD BP:      24.8 ml  RIGHT VENTRICLE            IVC RV S prime:     6.96 cm/s  IVC diam: 2.00 cm TAPSE (M-mode): 2.1 cm  LEFT ATRIUM             Index       RIGHT ATRIUM           Index LA diam:        3.40 cm 2.19 cm/m  RA Area:     10.20 cm LA Vol (A2C):   34.5 ml 22.18 ml/m RA Volume:   19.70 ml  12.67 ml/m LA Vol (A4C):   25.0 ml  16.07 ml/m LA Biplane Vol: 30.0 ml 19.29 ml/m AORTIC VALVE LVOT Vmax:   80.30 cm/s LVOT Vmean:  53.100 cm/s LVOT VTI:    0.153 m  AORTA Ao Root diam: 2.60 cm Ao Asc diam:  3.10 cm Ao Desc diam: 2.00 cm  MITRAL VALVE               TRICUSPID VALVE MV Area (PHT): 4.49 cm    TR Peak grad:   13.1 mmHg MV Decel Time: 169 msec    TR Vmax:        181.00 cm/s MV E velocity: 75.40 cm/s MV A velocity: 72.00 cm/s  SHUNTS MV E/A ratio:  1.05        Systemic VTI:  0.15 m Systemic Diam: 1.90 cm  Norman Herrlich MD Electronically signed by Norman Herrlich MD Signature Date/Time: 01/19/2021/5:12:15 PM    Final             Risk Assessment/Calculations:             Physical Exam:   VS:  BP 135/75 (BP Location: Right Arm, Patient Position: Sitting, Cuff Size: Small)   Pulse 82   Ht 4\' 10"  (1.473 m)   Wt 125 lb (56.7 kg)   SpO2 92%   BMI 26.13 kg/m    Wt Readings from Last 3 Encounters:  05/24/23 125 lb (56.7 kg)  11/24/21 140 lb (63.5 kg)  04/06/21 141 lb 3.2 oz (64 kg)    GEN: Well nourished, well developed in no acute distress NECK: No JVD; No carotid bruits CARDIAC: RRR, no murmurs, rubs, gallops RESPIRATORY:  Clear to auscultation without rales, wheezing or rhonchi  ABDOMEN: Soft, non-tender, non-distended EXTREMITIES:  No edema; No deformity   ASSESSMENT AND PLAN: .   Syncope and collapse-3 events within the time spent 15 minutes as outlined above in the HPI, it appears  she was transfused with blood after this.  A monitor was arranged which revealed 1 episode of VT and 2 episodes of SVT.  She has had an ischemic evaluation recently which was negative for ischemia.  Will repeat an echogram for any abnormalities, but it sounds like possibly a been anemia who proposed with dehydration. HTN -blood pressure is controlled 135/75, continue Exforge 5-160 mg daily. COPD/tobacco abuse-typically on oxygen 2 to 3 L daily, she forgot her oxygen tank today.  She does continue to smoke however  she does not smoke while she is wearing her oxygen.  She is aware she needs to completely stop smoking and this has been very difficult and challenging for her but she feels like she can stop. SVT/VT -she is not bothered by her palpitations, they were found incidentally on a recent monitor.  Do not think we need to do an ischemic evaluation as she just had one and it was negative.  Will repeat an echocardiogram for any abnormalities.        Dispo: Echocardiogram, follow-up in 6 months with Dr. Bing Matter.  Signed, Flossie Dibble, NP

## 2023-05-31 ENCOUNTER — Telehealth: Payer: Self-pay | Admitting: *Deleted

## 2023-05-31 DIAGNOSIS — I4729 Other ventricular tachycardia: Secondary | ICD-10-CM

## 2023-05-31 DIAGNOSIS — I471 Supraventricular tachycardia, unspecified: Secondary | ICD-10-CM

## 2023-05-31 DIAGNOSIS — R55 Syncope and collapse: Secondary | ICD-10-CM

## 2023-05-31 NOTE — Telephone Encounter (Signed)
-----   Message from Flossie Dibble sent at 05/30/2023  7:59 AM EST ----- Can we refer to EP for syncope and episode of VT per Dr. Bing Matter? ----- Message ----- From: Georgeanna Lea, MD Sent: 05/29/2023   8:15 AM EST To: Flossie Dibble, NP  Needs to be send to EP ----- Message ----- From: Flossie Dibble, NP Sent: 05/24/2023   4:51 PM EST To: Georgeanna Lea, MD  She apparently passed out 3 times within the span of 15 minutes, not sure if she was sent to the hospital or not she is a very poor historian I cannot see where she was evaluated in the emergency department but it does look like she received a unit of PRBCs.  Her PCP then put a monitor on her which revealed 1 6 beat episode of VT and 2 episodes of SVT so they wanted her to follow-up with [place].  She recently had an ischemic evaluation approximately 30 days ago which was negative for ischemia.  I am going to do an echocardiogram just since it has been about 2 years.  Do think I am missing anything?

## 2023-05-31 NOTE — Telephone Encounter (Signed)
Relayed Dr. Vanetta Shawl recommendation to see Dr. Elberta Fortis. Pt was agreeable and referral was ordered.

## 2023-06-11 DIAGNOSIS — M47816 Spondylosis without myelopathy or radiculopathy, lumbar region: Secondary | ICD-10-CM | POA: Diagnosis not present

## 2023-06-18 ENCOUNTER — Ambulatory Visit: Payer: Medicare HMO

## 2023-06-19 ENCOUNTER — Telehealth: Payer: Self-pay | Admitting: Cardiology

## 2023-06-19 ENCOUNTER — Other Ambulatory Visit: Payer: Medicare HMO

## 2023-06-19 DIAGNOSIS — Z79891 Long term (current) use of opiate analgesic: Secondary | ICD-10-CM | POA: Diagnosis not present

## 2023-06-19 DIAGNOSIS — M4316 Spondylolisthesis, lumbar region: Secondary | ICD-10-CM | POA: Diagnosis not present

## 2023-06-19 DIAGNOSIS — M7062 Trochanteric bursitis, left hip: Secondary | ICD-10-CM | POA: Diagnosis not present

## 2023-06-19 DIAGNOSIS — Z96651 Presence of right artificial knee joint: Secondary | ICD-10-CM | POA: Diagnosis not present

## 2023-06-19 DIAGNOSIS — Z96652 Presence of left artificial knee joint: Secondary | ICD-10-CM | POA: Diagnosis not present

## 2023-06-19 DIAGNOSIS — M4310 Spondylolisthesis, site unspecified: Secondary | ICD-10-CM | POA: Diagnosis not present

## 2023-06-19 DIAGNOSIS — M48061 Spinal stenosis, lumbar region without neurogenic claudication: Secondary | ICD-10-CM | POA: Diagnosis not present

## 2023-06-19 DIAGNOSIS — M5416 Radiculopathy, lumbar region: Secondary | ICD-10-CM | POA: Diagnosis not present

## 2023-06-19 DIAGNOSIS — G894 Chronic pain syndrome: Secondary | ICD-10-CM | POA: Diagnosis not present

## 2023-06-19 NOTE — Telephone Encounter (Signed)
I called and spoke with the pt who states she wants someone to know that [facility] has never received anything from pre-cert requesting a PA for her echo and that it needs to be resent.

## 2023-06-19 NOTE — Telephone Encounter (Signed)
Pt states that [employer] never got the authorization for the pt to have the ECHO. Please advise

## 2023-06-19 NOTE — Telephone Encounter (Signed)
Pt is returning nurses phone call. Please advise

## 2023-07-02 DIAGNOSIS — M1612 Unilateral primary osteoarthritis, left hip: Secondary | ICD-10-CM | POA: Diagnosis not present

## 2023-07-02 DIAGNOSIS — M25562 Pain in left knee: Secondary | ICD-10-CM | POA: Diagnosis not present

## 2023-07-04 ENCOUNTER — Other Ambulatory Visit: Payer: Self-pay | Admitting: Cardiology

## 2023-07-04 DIAGNOSIS — I471 Supraventricular tachycardia, unspecified: Secondary | ICD-10-CM

## 2023-07-04 DIAGNOSIS — E785 Hyperlipidemia, unspecified: Secondary | ICD-10-CM

## 2023-07-04 DIAGNOSIS — F17219 Nicotine dependence, cigarettes, with unspecified nicotine-induced disorders: Secondary | ICD-10-CM

## 2023-07-04 DIAGNOSIS — J410 Simple chronic bronchitis: Secondary | ICD-10-CM

## 2023-07-04 DIAGNOSIS — R55 Syncope and collapse: Secondary | ICD-10-CM

## 2023-07-04 DIAGNOSIS — I4729 Other ventricular tachycardia: Secondary | ICD-10-CM

## 2023-07-04 DIAGNOSIS — I10 Essential (primary) hypertension: Secondary | ICD-10-CM

## 2023-07-09 DIAGNOSIS — M7062 Trochanteric bursitis, left hip: Secondary | ICD-10-CM | POA: Diagnosis not present

## 2023-07-11 ENCOUNTER — Other Ambulatory Visit: Payer: Medicare HMO

## 2023-07-19 ENCOUNTER — Telehealth: Payer: Self-pay | Admitting: Cardiology

## 2023-07-19 NOTE — Telephone Encounter (Signed)
-----   Message from Maryruth Hancock sent at 07/18/2023  5:01 PM EST ----- Regarding: RE: Echo Yes it is still denied. The provider never responded and did the peer to peer. ----- Message ----- From: Cordie Grice Sent: 07/18/2023   3:42 PM EST To: Eliberto Ivory Subject: RE: Echo                                       Is the 2d echo still denied? Could you double check for me before I call her and cancel? ----- Message ----- From: Bolivar Haw Sent: 07/03/2023   1:00 PM EST To: Lorre Munroe Subject: RE: Flavia Shipper we will let her know. ----- Message ----- From: Jules Husbands Sent: 07/03/2023  10:53 AM EST To: Bolivar Haw; Cordie Grice Subject: RE: Echo                                       Hey guys,   Disregard previous message. I didn't realize which pt I was sending message on. Her insurance in fact did Deny her echo and I haven't gotten a response back from the provider to schedule p2p yet. ----- Message ----- From: Bolivar Haw Sent: 07/03/2023   9:42 AM EST To: Lorre Munroe Subject: RE: Echo                                       Okay thank you! Have a great day!   Hermenia Bers ----- Message ----- From: Jules Husbands Sent: 07/02/2023   5:06 PM EST To: Elmarie Mainland Subject: RE: Echo                                       Hey,  Thank you for letting me know. Her ins will cover a 2D Echo. The 3D Echo was denied. If provider wants 3D performed we will schedule a peer to peer. No updated from her provider as of yet, so she is good to go unless they state other wise. ----- Message ----- From: Bolivar Haw Sent: 07/02/2023   4:22 PM EST To: Lorre Munroe Subject: Echo                                           Redgie Grayer, someone from the call center just called and said Ms. Kofoed called back to r/s  her echo stating she spoke with her insurance company and they told her there was no precert required for the echo. So she is back on the schedule for 08/01/23.   Thanks,  [employer]

## 2023-07-19 NOTE — Telephone Encounter (Signed)
LVM to make patient aware of insurance denial/kbl 07/19/23

## 2023-07-22 NOTE — Telephone Encounter (Signed)
Patient is requesting call back in regards to the denial. States that an appeal needs to be sent in.

## 2023-07-23 DIAGNOSIS — M47816 Spondylosis without myelopathy or radiculopathy, lumbar region: Secondary | ICD-10-CM | POA: Diagnosis not present

## 2023-07-30 ENCOUNTER — Encounter (HOSPITAL_COMMUNITY): Payer: Self-pay

## 2023-07-31 DIAGNOSIS — Z79891 Long term (current) use of opiate analgesic: Secondary | ICD-10-CM | POA: Diagnosis not present

## 2023-07-31 DIAGNOSIS — M4316 Spondylolisthesis, lumbar region: Secondary | ICD-10-CM | POA: Diagnosis not present

## 2023-07-31 DIAGNOSIS — G894 Chronic pain syndrome: Secondary | ICD-10-CM | POA: Diagnosis not present

## 2023-07-31 DIAGNOSIS — M48061 Spinal stenosis, lumbar region without neurogenic claudication: Secondary | ICD-10-CM | POA: Diagnosis not present

## 2023-07-31 DIAGNOSIS — M4310 Spondylolisthesis, site unspecified: Secondary | ICD-10-CM | POA: Diagnosis not present

## 2023-07-31 DIAGNOSIS — Z1389 Encounter for screening for other disorder: Secondary | ICD-10-CM | POA: Diagnosis not present

## 2023-07-31 DIAGNOSIS — Z96652 Presence of left artificial knee joint: Secondary | ICD-10-CM | POA: Diagnosis not present

## 2023-07-31 DIAGNOSIS — M25511 Pain in right shoulder: Secondary | ICD-10-CM | POA: Diagnosis not present

## 2023-07-31 DIAGNOSIS — M7062 Trochanteric bursitis, left hip: Secondary | ICD-10-CM | POA: Diagnosis not present

## 2023-07-31 DIAGNOSIS — M5416 Radiculopathy, lumbar region: Secondary | ICD-10-CM | POA: Diagnosis not present

## 2023-07-31 DIAGNOSIS — Z96651 Presence of right artificial knee joint: Secondary | ICD-10-CM | POA: Diagnosis not present

## 2023-08-01 ENCOUNTER — Ambulatory Visit: Payer: Medicare PPO

## 2023-08-12 ENCOUNTER — Institutional Professional Consult (permissible substitution): Payer: Medicare HMO | Admitting: Cardiology

## 2023-08-20 DIAGNOSIS — J4540 Moderate persistent asthma, uncomplicated: Secondary | ICD-10-CM | POA: Diagnosis not present

## 2023-08-22 ENCOUNTER — Ambulatory Visit: Payer: Medicare PPO | Attending: Cardiology

## 2023-08-22 DIAGNOSIS — I10 Essential (primary) hypertension: Secondary | ICD-10-CM | POA: Diagnosis not present

## 2023-08-22 DIAGNOSIS — I471 Supraventricular tachycardia, unspecified: Secondary | ICD-10-CM | POA: Diagnosis not present

## 2023-08-22 DIAGNOSIS — J410 Simple chronic bronchitis: Secondary | ICD-10-CM | POA: Diagnosis not present

## 2023-08-22 DIAGNOSIS — F17219 Nicotine dependence, cigarettes, with unspecified nicotine-induced disorders: Secondary | ICD-10-CM

## 2023-08-22 DIAGNOSIS — E785 Hyperlipidemia, unspecified: Secondary | ICD-10-CM

## 2023-08-22 DIAGNOSIS — R55 Syncope and collapse: Secondary | ICD-10-CM

## 2023-08-22 LAB — ECHOCARDIOGRAM COMPLETE: S' Lateral: 2.5 cm

## 2023-08-26 DIAGNOSIS — J449 Chronic obstructive pulmonary disease, unspecified: Secondary | ICD-10-CM | POA: Diagnosis not present

## 2023-08-26 DIAGNOSIS — J301 Allergic rhinitis due to pollen: Secondary | ICD-10-CM | POA: Diagnosis not present

## 2023-08-26 DIAGNOSIS — R918 Other nonspecific abnormal finding of lung field: Secondary | ICD-10-CM | POA: Diagnosis not present

## 2023-08-26 DIAGNOSIS — I2510 Atherosclerotic heart disease of native coronary artery without angina pectoris: Secondary | ICD-10-CM | POA: Diagnosis not present

## 2023-08-26 DIAGNOSIS — F17210 Nicotine dependence, cigarettes, uncomplicated: Secondary | ICD-10-CM | POA: Diagnosis not present

## 2023-08-29 DIAGNOSIS — M7062 Trochanteric bursitis, left hip: Secondary | ICD-10-CM | POA: Diagnosis not present

## 2023-08-29 DIAGNOSIS — Z79891 Long term (current) use of opiate analgesic: Secondary | ICD-10-CM | POA: Diagnosis not present

## 2023-08-29 DIAGNOSIS — G894 Chronic pain syndrome: Secondary | ICD-10-CM | POA: Diagnosis not present

## 2023-08-29 DIAGNOSIS — Z96651 Presence of right artificial knee joint: Secondary | ICD-10-CM | POA: Diagnosis not present

## 2023-08-29 DIAGNOSIS — Z1389 Encounter for screening for other disorder: Secondary | ICD-10-CM | POA: Diagnosis not present

## 2023-08-29 DIAGNOSIS — M48061 Spinal stenosis, lumbar region without neurogenic claudication: Secondary | ICD-10-CM | POA: Diagnosis not present

## 2023-08-29 DIAGNOSIS — M4316 Spondylolisthesis, lumbar region: Secondary | ICD-10-CM | POA: Diagnosis not present

## 2023-08-29 DIAGNOSIS — M5416 Radiculopathy, lumbar region: Secondary | ICD-10-CM | POA: Diagnosis not present

## 2023-08-29 DIAGNOSIS — M4310 Spondylolisthesis, site unspecified: Secondary | ICD-10-CM | POA: Diagnosis not present

## 2023-09-02 ENCOUNTER — Encounter: Payer: Self-pay | Admitting: Cardiology

## 2023-09-02 ENCOUNTER — Ambulatory Visit: Payer: Medicare PPO | Attending: Cardiology | Admitting: Cardiology

## 2023-09-02 VITALS — BP 118/64 | HR 87 | Ht <= 58 in | Wt 127.2 lb

## 2023-09-02 DIAGNOSIS — R55 Syncope and collapse: Secondary | ICD-10-CM | POA: Diagnosis not present

## 2023-09-02 DIAGNOSIS — I10 Essential (primary) hypertension: Secondary | ICD-10-CM

## 2023-09-02 DIAGNOSIS — I471 Supraventricular tachycardia, unspecified: Secondary | ICD-10-CM | POA: Diagnosis not present

## 2023-09-02 NOTE — Patient Instructions (Signed)
 Medication Instructions:  Your physician recommends that you continue on your current medications as directed. Please refer to the Current Medication list given to you today.  Labwork: None ordered.  Testing/Procedures: None ordered.  Follow-Up: We will call to arrange this implant in [facility] office [facility]).    Implantable Loop Recorder Placement, Care After This sheet gives you information about how to care for yourself after your procedure. Your health care provider may also give you more specific instructions. If you have problems or questions, contact your health care provider. What can I expect after the procedure? After the procedure, it is common to have: Soreness or discomfort near the incision. Some swelling or bruising near the incision.  Follow these instructions at home: Incision care  Monitor your cardiac device site for redness, swelling, and drainage. Call the device clinic at [phone] if you experience these symptoms or fever/chills.  Keep the large square bandage on your site for 24 hours and then you may remove it yourself. Keep the steri-strips underneath in place.   You may shower after 72 hours / 3 days from your procedure with the steri-strips in place. They will usually fall off on their own, or may be removed after 10 days. Pat dry.   Avoid lotions, ointments, or perfumes over your incision until it is well-healed.  Please do not submerge in water until your site is completely healed.   Your device is MRI compatible.   Remote monitoring is used to monitor your cardiac device from home. This monitoring is scheduled every month by our office. It allows us  to keep an eye on the function of your device to ensure it is working properly.  If your wound site starts to bleed apply pressure.    For help with the monitor please call [employer] Monitor Support Specialist directly at [phone].    If you have any questions/concerns please call  the device clinic at [phone].  Activity  Return to your normal activities.  General instructions Follow instructions from your health care provider about how to manage your implantable loop recorder and transmit the information. Learn how to activate a recording if this is necessary for your type of device. You may go through a metal detection gate, and you may let someone hold a metal detector over your chest. Show your ID card if needed. Do not have an MRI unless you check with your health care provider first. Take over-the-counter and prescription medicines only as told by your health care provider. Keep all follow-up visits as told by your health care provider. This is important. Contact a health care provider if: You have redness, swelling, or pain around your incision. You have a fever. You have pain that is not relieved by your pain medicine. You have triggered your device because of fainting (syncope) or because of a heartbeat that feels like it is racing, slow, fluttering, or skipping (palpitations). Get help right away if you have: Chest pain. Difficulty breathing. Summary After the procedure, it is common to have soreness or discomfort near the incision. Change your dressing as told by your health care provider. Follow instructions from your health care provider about how to manage your implantable loop recorder and transmit the information. Keep all follow-up visits as told by your health care provider. This is important. This information is not intended to replace advice given to you by your health care provider. Make sure you discuss any questions you have with your health care provider. Document Released: 06/20/2015  Document Revised: 08/24/2017 Document Reviewed: 08/24/2017 Elsevier Patient Education  2020 [employer].

## 2023-09-02 NOTE — Progress Notes (Signed)
  Electrophysiology Office Note:   Date:  09/02/2023  ID:  Natasha Meyer, DOB [birth year], MRN [id number]  Primary Cardiologist: Ralene Burger, MD Primary Heart Failure: None Electrophysiologist: None      History of Present Illness:   Natasha Meyer is a 68 y.o. female with h/o, COPD, GERD, tobacco abuse, diabetes, hyperlipidemia seen today for  for Electrophysiology evaluation of syncope at the request of Jaclyne Pullum.    She had an 3 episodes of syncope in November 2024.  She was feeling well prior to the episodes.  She got out of the shower, and woke up on the floor.  She then stood up again, and had another episode of syncope while brushing her teeth.  She moved into the bedroom and is holding onto a dresser, and again had an episode of syncope.  She wore a cardiac monitor that showed sinus rhythm.  She had a 6 beat run of nonsustained VT and 2 episodes of SVT.  She is not have an elevated PVC burden.  She has not had any further episodes of syncope.  She has felt well without complaints.  She had no prodrome prior to the episodes.  She felt well in between and after each episode.  Review of systems complete and found to be negative unless listed in HPI.   EP Information / Studies Reviewed:    EKG is ordered today. Personal review as below.  EKG Interpretation Date/Time:  Monday September 02 2023 10:37:30 EST Ventricular Rate:  87 PR Interval:  144 QRS Duration:  74 QT Interval:  326 QTC Calculation: 392 R Axis:   59  Text Interpretation: Normal sinus rhythm Normal ECG When compared with ECG of 24-May-2023 14:38, No significant change was found Confirmed by Denario Bagot ([id number]) on 09/02/2023 10:50:18 AM     Risk Assessment/Calculations:              Physical Exam:   VS:  BP 118/64   Pulse 87   Ht 4\' 10"  (1.473 m)   Wt 127 lb 3.2 oz (57.7 kg)   SpO2 97%   BMI 26.58 kg/m    Wt Readings from Last 3 Encounters:  09/02/23 127 lb 3.2 oz (57.7 kg)  05/24/23 125 lb (56.7  kg)  11/24/21 140 lb (63.5 kg)     GEN: Well nourished, well developed in no acute distress NECK: No JVD; No carotid bruits CARDIAC: Regular rate and rhythm, no murmurs, rubs, gallops RESPIRATORY:  Clear to auscultation without rales, wheezing or rhonchi  ABDOMEN: Soft, non-tender, non-distended EXTREMITIES:  No edema; No deformity   ASSESSMENT AND PLAN:    1.  Syncope: Unclear as to the cause of her episode of syncope.  She had 3 events within 15 minutes.  She has had no subsequent events.  Cardiac monitor is unrevealing.  She would benefit from ILR implant.  Risks and benefits have been discussed.  Risk include bleeding and infection.  She understands the risks and is agreed to the procedure.  2.  Hypertension: Currently well-controlled  3.  SVT/VT: Patient asymptomatic.  Short episodes.  Continue monitoring.  Follow up with Dr. Lawana Pray  post ILR implant   Signed, Nieves Chapa Cortland Ding, MD

## 2023-09-16 ENCOUNTER — Telehealth: Payer: Self-pay | Admitting: Cardiology

## 2023-09-16 NOTE — Telephone Encounter (Signed)
 Aware forwarding this to our precert/billing dept to follow up on/update billing  (ILR implant scheduled for 4/22)

## 2023-09-16 NOTE — Telephone Encounter (Signed)
 Pt has [employer] right now as of March 1st she will have [employer]. Pt would like a c/b to discuss what to do

## 2023-09-20 DIAGNOSIS — R0789 Other chest pain: Secondary | ICD-10-CM | POA: Diagnosis not present

## 2023-09-20 DIAGNOSIS — S299XXA Unspecified injury of thorax, initial encounter: Secondary | ICD-10-CM | POA: Diagnosis not present

## 2023-09-20 DIAGNOSIS — M25561 Pain in right knee: Secondary | ICD-10-CM | POA: Diagnosis not present

## 2023-09-20 DIAGNOSIS — J4540 Moderate persistent asthma, uncomplicated: Secondary | ICD-10-CM | POA: Diagnosis not present

## 2023-09-20 DIAGNOSIS — W19XXXA Unspecified fall, initial encounter: Secondary | ICD-10-CM | POA: Diagnosis not present

## 2023-09-20 DIAGNOSIS — S2341XA Sprain of ribs, initial encounter: Secondary | ICD-10-CM | POA: Diagnosis not present

## 2023-09-24 ENCOUNTER — Encounter: Payer: Self-pay | Admitting: Cardiology

## 2023-09-26 DIAGNOSIS — Z1389 Encounter for screening for other disorder: Secondary | ICD-10-CM | POA: Diagnosis not present

## 2023-09-26 DIAGNOSIS — Z96651 Presence of right artificial knee joint: Secondary | ICD-10-CM | POA: Diagnosis not present

## 2023-09-26 DIAGNOSIS — M5416 Radiculopathy, lumbar region: Secondary | ICD-10-CM | POA: Diagnosis not present

## 2023-09-26 DIAGNOSIS — M7062 Trochanteric bursitis, left hip: Secondary | ICD-10-CM | POA: Diagnosis not present

## 2023-09-26 DIAGNOSIS — G2581 Restless legs syndrome: Secondary | ICD-10-CM | POA: Diagnosis not present

## 2023-09-26 DIAGNOSIS — M461 Sacroiliitis, not elsewhere classified: Secondary | ICD-10-CM | POA: Diagnosis not present

## 2023-09-26 DIAGNOSIS — M4310 Spondylolisthesis, site unspecified: Secondary | ICD-10-CM | POA: Diagnosis not present

## 2023-09-26 DIAGNOSIS — Z79891 Long term (current) use of opiate analgesic: Secondary | ICD-10-CM | POA: Diagnosis not present

## 2023-09-26 DIAGNOSIS — Z96652 Presence of left artificial knee joint: Secondary | ICD-10-CM | POA: Diagnosis not present

## 2023-09-26 DIAGNOSIS — M4316 Spondylolisthesis, lumbar region: Secondary | ICD-10-CM | POA: Diagnosis not present

## 2023-09-26 DIAGNOSIS — M48061 Spinal stenosis, lumbar region without neurogenic claudication: Secondary | ICD-10-CM | POA: Diagnosis not present

## 2023-10-01 DIAGNOSIS — M19011 Primary osteoarthritis, right shoulder: Secondary | ICD-10-CM | POA: Diagnosis not present

## 2023-10-02 DIAGNOSIS — H43393 Other vitreous opacities, bilateral: Secondary | ICD-10-CM | POA: Diagnosis not present

## 2023-10-02 DIAGNOSIS — H524 Presbyopia: Secondary | ICD-10-CM | POA: Diagnosis not present

## 2023-10-02 DIAGNOSIS — H47292 Other optic atrophy, left eye: Secondary | ICD-10-CM | POA: Diagnosis not present

## 2023-10-14 DIAGNOSIS — J449 Chronic obstructive pulmonary disease, unspecified: Secondary | ICD-10-CM | POA: Diagnosis not present

## 2023-10-14 DIAGNOSIS — Z9981 Dependence on supplemental oxygen: Secondary | ICD-10-CM | POA: Diagnosis not present

## 2023-10-14 DIAGNOSIS — R4182 Altered mental status, unspecified: Secondary | ICD-10-CM | POA: Diagnosis not present

## 2023-10-14 DIAGNOSIS — R9431 Abnormal electrocardiogram [ECG] [EKG]: Secondary | ICD-10-CM | POA: Diagnosis not present

## 2023-10-14 DIAGNOSIS — R0902 Hypoxemia: Secondary | ICD-10-CM | POA: Diagnosis not present

## 2023-10-14 DIAGNOSIS — N179 Acute kidney failure, unspecified: Secondary | ICD-10-CM | POA: Diagnosis not present

## 2023-10-14 DIAGNOSIS — J9690 Respiratory failure, unspecified, unspecified whether with hypoxia or hypercapnia: Secondary | ICD-10-CM | POA: Diagnosis not present

## 2023-10-14 DIAGNOSIS — R059 Cough, unspecified: Secondary | ICD-10-CM | POA: Diagnosis not present

## 2023-10-14 DIAGNOSIS — I959 Hypotension, unspecified: Secondary | ICD-10-CM | POA: Diagnosis not present

## 2023-10-14 DIAGNOSIS — I10 Essential (primary) hypertension: Secondary | ICD-10-CM | POA: Diagnosis not present

## 2023-10-14 DIAGNOSIS — Z743 Need for continuous supervision: Secondary | ICD-10-CM | POA: Diagnosis not present

## 2023-10-14 DIAGNOSIS — R000 Tachycardia, unspecified: Secondary | ICD-10-CM | POA: Diagnosis not present

## 2023-10-14 DIAGNOSIS — J441 Chronic obstructive pulmonary disease with (acute) exacerbation: Secondary | ICD-10-CM | POA: Diagnosis not present

## 2023-10-14 DIAGNOSIS — F69 Unspecified disorder of adult personality and behavior: Secondary | ICD-10-CM | POA: Diagnosis not present

## 2023-10-14 DIAGNOSIS — I510 Cardiac septal defect, acquired: Secondary | ICD-10-CM | POA: Diagnosis not present

## 2023-10-14 DIAGNOSIS — Z79899 Other long term (current) drug therapy: Secondary | ICD-10-CM | POA: Diagnosis not present

## 2023-10-14 DIAGNOSIS — R410 Disorientation, unspecified: Secondary | ICD-10-CM | POA: Diagnosis not present

## 2023-10-15 DIAGNOSIS — J441 Chronic obstructive pulmonary disease with (acute) exacerbation: Secondary | ICD-10-CM | POA: Diagnosis not present

## 2023-10-15 DIAGNOSIS — R410 Disorientation, unspecified: Secondary | ICD-10-CM | POA: Diagnosis not present

## 2023-10-15 DIAGNOSIS — R059 Cough, unspecified: Secondary | ICD-10-CM | POA: Diagnosis not present

## 2023-10-15 DIAGNOSIS — R000 Tachycardia, unspecified: Secondary | ICD-10-CM | POA: Diagnosis not present

## 2023-10-15 DIAGNOSIS — R4182 Altered mental status, unspecified: Secondary | ICD-10-CM | POA: Diagnosis not present

## 2023-10-15 DIAGNOSIS — R9431 Abnormal electrocardiogram [ECG] [EKG]: Secondary | ICD-10-CM | POA: Diagnosis not present

## 2023-10-15 DIAGNOSIS — G894 Chronic pain syndrome: Secondary | ICD-10-CM | POA: Diagnosis not present

## 2023-10-16 DIAGNOSIS — R410 Disorientation, unspecified: Secondary | ICD-10-CM | POA: Diagnosis not present

## 2023-10-16 DIAGNOSIS — J441 Chronic obstructive pulmonary disease with (acute) exacerbation: Secondary | ICD-10-CM | POA: Diagnosis not present

## 2023-10-16 DIAGNOSIS — G894 Chronic pain syndrome: Secondary | ICD-10-CM | POA: Diagnosis not present

## 2023-10-17 DIAGNOSIS — R0902 Hypoxemia: Secondary | ICD-10-CM | POA: Diagnosis not present

## 2023-10-17 DIAGNOSIS — R069 Unspecified abnormalities of breathing: Secondary | ICD-10-CM | POA: Diagnosis not present

## 2023-10-17 DIAGNOSIS — R9431 Abnormal electrocardiogram [ECG] [EKG]: Secondary | ICD-10-CM | POA: Diagnosis not present

## 2023-10-17 DIAGNOSIS — J9600 Acute respiratory failure, unspecified whether with hypoxia or hypercapnia: Secondary | ICD-10-CM | POA: Diagnosis not present

## 2023-10-17 DIAGNOSIS — R000 Tachycardia, unspecified: Secondary | ICD-10-CM | POA: Diagnosis not present

## 2023-10-17 DIAGNOSIS — Z743 Need for continuous supervision: Secondary | ICD-10-CM | POA: Diagnosis not present

## 2023-10-17 DIAGNOSIS — J441 Chronic obstructive pulmonary disease with (acute) exacerbation: Secondary | ICD-10-CM | POA: Diagnosis not present

## 2023-10-18 DIAGNOSIS — Z91040 Latex allergy status: Secondary | ICD-10-CM | POA: Diagnosis not present

## 2023-10-18 DIAGNOSIS — J9621 Acute and chronic respiratory failure with hypoxia: Secondary | ICD-10-CM | POA: Diagnosis not present

## 2023-10-18 DIAGNOSIS — Z791 Long term (current) use of non-steroidal anti-inflammatories (NSAID): Secondary | ICD-10-CM | POA: Diagnosis not present

## 2023-10-18 DIAGNOSIS — R5381 Other malaise: Secondary | ICD-10-CM | POA: Diagnosis not present

## 2023-10-18 DIAGNOSIS — R9431 Abnormal electrocardiogram [ECG] [EKG]: Secondary | ICD-10-CM | POA: Diagnosis not present

## 2023-10-18 DIAGNOSIS — J301 Allergic rhinitis due to pollen: Secondary | ICD-10-CM | POA: Diagnosis not present

## 2023-10-18 DIAGNOSIS — J4540 Moderate persistent asthma, uncomplicated: Secondary | ICD-10-CM | POA: Diagnosis not present

## 2023-10-18 DIAGNOSIS — Z9981 Dependence on supplemental oxygen: Secondary | ICD-10-CM | POA: Diagnosis not present

## 2023-10-18 DIAGNOSIS — Z882 Allergy status to sulfonamides status: Secondary | ICD-10-CM | POA: Diagnosis not present

## 2023-10-18 DIAGNOSIS — J9690 Respiratory failure, unspecified, unspecified whether with hypoxia or hypercapnia: Secondary | ICD-10-CM | POA: Diagnosis not present

## 2023-10-18 DIAGNOSIS — F419 Anxiety disorder, unspecified: Secondary | ICD-10-CM | POA: Diagnosis not present

## 2023-10-18 DIAGNOSIS — F17210 Nicotine dependence, cigarettes, uncomplicated: Secondary | ICD-10-CM | POA: Diagnosis not present

## 2023-10-18 DIAGNOSIS — Z79891 Long term (current) use of opiate analgesic: Secondary | ICD-10-CM | POA: Diagnosis not present

## 2023-10-18 DIAGNOSIS — Z79899 Other long term (current) drug therapy: Secondary | ICD-10-CM | POA: Diagnosis not present

## 2023-10-18 DIAGNOSIS — D509 Iron deficiency anemia, unspecified: Secondary | ICD-10-CM | POA: Diagnosis not present

## 2023-10-18 DIAGNOSIS — R000 Tachycardia, unspecified: Secondary | ICD-10-CM | POA: Diagnosis not present

## 2023-10-18 DIAGNOSIS — J449 Chronic obstructive pulmonary disease, unspecified: Secondary | ICD-10-CM | POA: Diagnosis not present

## 2023-10-18 DIAGNOSIS — R0602 Shortness of breath: Secondary | ICD-10-CM | POA: Diagnosis not present

## 2023-10-18 DIAGNOSIS — Z1152 Encounter for screening for COVID-19: Secondary | ICD-10-CM | POA: Diagnosis not present

## 2023-10-18 DIAGNOSIS — J180 Bronchopneumonia, unspecified organism: Secondary | ICD-10-CM | POA: Diagnosis not present

## 2023-10-18 DIAGNOSIS — R918 Other nonspecific abnormal finding of lung field: Secondary | ICD-10-CM | POA: Diagnosis not present

## 2023-10-18 DIAGNOSIS — J219 Acute bronchiolitis, unspecified: Secondary | ICD-10-CM | POA: Diagnosis not present

## 2023-10-18 DIAGNOSIS — M1990 Unspecified osteoarthritis, unspecified site: Secondary | ICD-10-CM | POA: Diagnosis not present

## 2023-10-18 DIAGNOSIS — Z885 Allergy status to narcotic agent status: Secondary | ICD-10-CM | POA: Diagnosis not present

## 2023-10-18 DIAGNOSIS — J440 Chronic obstructive pulmonary disease with acute lower respiratory infection: Secondary | ICD-10-CM | POA: Diagnosis not present

## 2023-10-18 DIAGNOSIS — Z7951 Long term (current) use of inhaled steroids: Secondary | ICD-10-CM | POA: Diagnosis not present

## 2023-10-18 DIAGNOSIS — J810 Acute pulmonary edema: Secondary | ICD-10-CM | POA: Diagnosis not present

## 2023-10-18 DIAGNOSIS — G894 Chronic pain syndrome: Secondary | ICD-10-CM | POA: Diagnosis not present

## 2023-10-18 DIAGNOSIS — G4730 Sleep apnea, unspecified: Secondary | ICD-10-CM | POA: Diagnosis not present

## 2023-10-18 DIAGNOSIS — I10 Essential (primary) hypertension: Secondary | ICD-10-CM | POA: Diagnosis not present

## 2023-10-18 DIAGNOSIS — J441 Chronic obstructive pulmonary disease with (acute) exacerbation: Secondary | ICD-10-CM | POA: Diagnosis not present

## 2023-10-18 DIAGNOSIS — Z888 Allergy status to other drugs, medicaments and biological substances status: Secondary | ICD-10-CM | POA: Diagnosis not present

## 2023-10-22 DIAGNOSIS — R918 Other nonspecific abnormal finding of lung field: Secondary | ICD-10-CM | POA: Diagnosis not present

## 2023-10-22 DIAGNOSIS — I2510 Atherosclerotic heart disease of native coronary artery without angina pectoris: Secondary | ICD-10-CM | POA: Diagnosis not present

## 2023-10-22 DIAGNOSIS — F17210 Nicotine dependence, cigarettes, uncomplicated: Secondary | ICD-10-CM | POA: Diagnosis not present

## 2023-10-22 DIAGNOSIS — J449 Chronic obstructive pulmonary disease, unspecified: Secondary | ICD-10-CM | POA: Diagnosis not present

## 2023-10-23 DIAGNOSIS — G2581 Restless legs syndrome: Secondary | ICD-10-CM | POA: Diagnosis not present

## 2023-10-23 DIAGNOSIS — E785 Hyperlipidemia, unspecified: Secondary | ICD-10-CM | POA: Diagnosis not present

## 2023-10-23 DIAGNOSIS — Z6825 Body mass index (BMI) 25.0-25.9, adult: Secondary | ICD-10-CM | POA: Diagnosis not present

## 2023-10-23 DIAGNOSIS — M797 Fibromyalgia: Secondary | ICD-10-CM | POA: Diagnosis not present

## 2023-10-23 DIAGNOSIS — F410 Panic disorder [episodic paroxysmal anxiety] without agoraphobia: Secondary | ICD-10-CM | POA: Diagnosis not present

## 2023-10-24 DIAGNOSIS — Z96651 Presence of right artificial knee joint: Secondary | ICD-10-CM | POA: Diagnosis not present

## 2023-10-24 DIAGNOSIS — M4316 Spondylolisthesis, lumbar region: Secondary | ICD-10-CM | POA: Diagnosis not present

## 2023-10-24 DIAGNOSIS — G2581 Restless legs syndrome: Secondary | ICD-10-CM | POA: Diagnosis not present

## 2023-10-24 DIAGNOSIS — M7062 Trochanteric bursitis, left hip: Secondary | ICD-10-CM | POA: Diagnosis not present

## 2023-10-24 DIAGNOSIS — M461 Sacroiliitis, not elsewhere classified: Secondary | ICD-10-CM | POA: Diagnosis not present

## 2023-10-24 DIAGNOSIS — Z79891 Long term (current) use of opiate analgesic: Secondary | ICD-10-CM | POA: Diagnosis not present

## 2023-10-24 DIAGNOSIS — Z1389 Encounter for screening for other disorder: Secondary | ICD-10-CM | POA: Diagnosis not present

## 2023-10-24 DIAGNOSIS — M5416 Radiculopathy, lumbar region: Secondary | ICD-10-CM | POA: Diagnosis not present

## 2023-10-24 DIAGNOSIS — M48061 Spinal stenosis, lumbar region without neurogenic claudication: Secondary | ICD-10-CM | POA: Diagnosis not present

## 2023-10-24 DIAGNOSIS — M4310 Spondylolisthesis, site unspecified: Secondary | ICD-10-CM | POA: Diagnosis not present

## 2023-10-24 DIAGNOSIS — Z96652 Presence of left artificial knee joint: Secondary | ICD-10-CM | POA: Diagnosis not present

## 2023-10-29 DIAGNOSIS — M47816 Spondylosis without myelopathy or radiculopathy, lumbar region: Secondary | ICD-10-CM | POA: Diagnosis not present

## 2023-11-04 DIAGNOSIS — J441 Chronic obstructive pulmonary disease with (acute) exacerbation: Secondary | ICD-10-CM | POA: Diagnosis not present

## 2023-11-12 ENCOUNTER — Ambulatory Visit: Payer: Medicare PPO | Attending: Cardiology | Admitting: Cardiology

## 2023-11-12 ENCOUNTER — Encounter: Payer: Self-pay | Admitting: Cardiology

## 2023-11-12 VITALS — BP 118/60 | HR 76 | Ht <= 58 in | Wt 116.0 lb

## 2023-11-12 DIAGNOSIS — I471 Supraventricular tachycardia, unspecified: Secondary | ICD-10-CM

## 2023-11-12 DIAGNOSIS — R55 Syncope and collapse: Secondary | ICD-10-CM | POA: Diagnosis not present

## 2023-11-12 DIAGNOSIS — I4729 Other ventricular tachycardia: Secondary | ICD-10-CM

## 2023-11-12 DIAGNOSIS — I10 Essential (primary) hypertension: Secondary | ICD-10-CM | POA: Diagnosis not present

## 2023-11-12 NOTE — Progress Notes (Signed)
 Electrophysiology Office Note:   Date:  11/12/2023  ID:  Natasha Meyer, DOB Apr 03, 1956, MRN [id number]  Primary Cardiologist: Ralene Burger, MD Primary Heart Failure: None Electrophysiologist: Calianna Kim Cortland Ding, MD      History of Present Illness:   Natasha Meyer is a 68 y.o. female with h/o COPD, GERD, tobacco abuse, diabetes, hyperlipidemia, syncope seen today for routine electrophysiology followup.   Since last being seen in our clinic the patient reports doing.  She has had no further episodes of syncope..  she denies chest pain, palpitations, dyspnea, PND, orthopnea, nausea, vomiting, dizziness, syncope, edema, weight gain, or early satiety.   Review of systems complete and found to be negative unless listed in HPI.   EP Information / Studies Reviewed:    EKG is not ordered today. EKG from 09/02/2023 reviewed which showed sinus rhythm        Risk Assessment/Calculations:              Physical Exam:   VS:  BP 118/60 (BP Location: Right Arm, Patient Position: Sitting, Cuff Size: Normal)   Pulse 76   Ht 4\' 10"  (1.473 m)   Wt 116 lb (52.6 kg)   SpO2 94%   BMI 24.24 kg/m    Wt Readings from Last 3 Encounters:  11/12/23 116 lb (52.6 kg)  09/02/23 127 lb 3.2 oz (57.7 kg)  05/24/23 125 lb (56.7 kg)     GEN: Well nourished, well developed in no acute distress NECK: No JVD; No carotid bruits CARDIAC: Regular rate and rhythm, no murmurs, rubs, gallops RESPIRATORY:  Clear to auscultation without rales, wheezing or rhonchi  ABDOMEN: Soft, non-tender, non-distended EXTREMITIES:  No edema; No deformity   ASSESSMENT AND PLAN:    1.  Syncope: Has had multiple episodes.  Cardiac monitor was unrevealing.  She would benefit from ILR implant.  Risk and benefits were discussed.  Risk include bleeding and infection.  She understands the risks and is agreed to the procedure.  2.  SVT/VT: Short episodes.  Continue monitoring.  3.  Hypertension: Well-controlled   Follow up  with Dr. Lawana Pray  PRN   Signed, Jozie Wulf Cortland Ding, MD   SURGEON:  Winferd Wease Cortland Ding, MD     PREPROCEDURE DIAGNOSIS:  Syncope    POSTPROCEDURE DIAGNOSIS: Syncope     PROCEDURES:   1. Implantable loop recorder implantation    INTRODUCTION:  Natasha Meyer presents with a history of syncope The costs of loop recorder monitoring have been discussed with the patient.    DESCRIPTION OF PROCEDURE:  Informed written consent was obtained.   Time Out Completed with RN    The patient required no sedation for the procedure today.  Mapping over the patient's chest was performed to identify the area where electrograms were most prominent for ILR recording.  This area was found to be the left parasternal region over the 4th intercostal space. The patients left chest was therefore prepped and draped in the usual sterile fashion. The skin overlying the left parasternal region was infiltrated with lidocaine  for local analgesia.  A 0.5-cm incision was made over the left parasternal region over the 3rd intercostal space.  A subcutaneous ILR pocket was fashioned using a combination of sharp and blunt dissection.  A Medtronic Reveal LINQ 2 implantable loop recorder (serial # [id number]) was then placed into the pocket  R waves were very prominent and measuring  0.52 .  Steri- Strips and a sterile dressing were then applied.  There were  no early apparent complications.     CONCLUSIONS:   1. Successful implantation of a implantable loop recorder for a history of Syncope.  2. No early apparent complications.   Pamula Luther Cortland Ding, MD  Cardiac Electrophysiology

## 2023-11-12 NOTE — Patient Instructions (Addendum)
Medication Instructions:  Your physician recommends that you continue on your current medications as directed. Please refer to the Current Medication list given to you today.  Labwork: None ordered.  Testing/Procedures: None ordered.  Follow-Up:  Your physician wants you to follow-up as needed with Dr. Camnitz    Implantable Loop Recorder Placement, Care After This sheet gives you information about how to care for yourself after your procedure. Your health care provider may also give you more specific instructions. If you have problems or questions, contact your health care provider. What can I expect after the procedure? After the procedure, it is common to have: Soreness or discomfort near the incision. Some swelling or bruising near the incision.  Follow these instructions at home: Incision care  Monitor your cardiac device site for redness, swelling, and drainage. Call the device clinic at [phone] if you experience these symptoms or fever/chills.  Keep the large square bandage on your site for 24 hours and then you may remove it yourself. Keep the steri-strips underneath in place.   You may shower after 72 hours / 3 days from your procedure with the steri-strips in place. They will usually fall off on their own, or may be removed after 10 days. Pat dry.   Avoid lotions, ointments, or perfumes over your incision until it is well-healed.  Please do not submerge in water until your site is completely healed.   Your device is MRI compatible.   Remote monitoring is used to monitor your cardiac device from home. This monitoring is scheduled every month by our office. It allows us to keep an eye on the function of your device to ensure it is working properly.  If your wound site starts to bleed apply pressure.    For help with the monitor please call Med[employer] Monitor Support Specialist directly at [phone].    If you have any questions/concerns please call the device  clinic at [phone].  Activity  Return to your normal activities.  General instructions Follow instructions from your health care provider about how to manage your implantable loop recorder and transmit the information. Learn how to activate a recording if this is necessary for your type of device. You may go through a metal detection gate, and you may let someone hold a metal detector over your chest. Show your ID card if needed. Do not have an MRI unless you check with your health care provider first. Take over-the-counter and prescription medicines only as told by your health care provider. Keep all follow-up visits as told by your health care provider. This is important. Contact a health care provider if: You have redness, swelling, or pain around your incision. You have a fever. You have pain that is not relieved by your pain medicine. You have triggered your device because of fainting (syncope) or because of a heartbeat that feels like it is racing, slow, fluttering, or skipping (palpitations). Get help right away if you have: Chest pain. Difficulty breathing. Summary After the procedure, it is common to have soreness or discomfort near the incision. Change your dressing as told by your health care provider. Follow instructions from your health care provider about how to manage your implantable loop recorder and transmit the information. Keep all follow-up visits as told by your health care provider. This is important. This information is not intended to replace advice given to you by your health care provider. Make sure you discuss any questions you have with your health care provider. Document Released: 06/20/2015   Document Revised: 08/24/2017 Document Reviewed: 08/24/2017 Elsevier Patient Education  2020 Elsevier Inc.  

## 2023-11-15 ENCOUNTER — Telehealth: Payer: Self-pay

## 2023-11-15 NOTE — Telephone Encounter (Signed)
 LMOVM for pt to give the device clinic a call back. We need the serial number to her Relay monitor to put into [facility].

## 2023-11-18 DIAGNOSIS — J4540 Moderate persistent asthma, uncomplicated: Secondary | ICD-10-CM | POA: Diagnosis not present

## 2023-11-19 NOTE — Telephone Encounter (Signed)
 Pt is going to take a picture of the bottom of her monitor and send it through my chart.

## 2023-11-27 DIAGNOSIS — F324 Major depressive disorder, single episode, in partial remission: Secondary | ICD-10-CM | POA: Diagnosis not present

## 2023-11-27 DIAGNOSIS — J4489 Other specified chronic obstructive pulmonary disease: Secondary | ICD-10-CM | POA: Diagnosis not present

## 2023-11-27 DIAGNOSIS — E785 Hyperlipidemia, unspecified: Secondary | ICD-10-CM | POA: Diagnosis not present

## 2023-11-27 DIAGNOSIS — R32 Unspecified urinary incontinence: Secondary | ICD-10-CM | POA: Diagnosis not present

## 2023-11-27 DIAGNOSIS — Z79891 Long term (current) use of opiate analgesic: Secondary | ICD-10-CM | POA: Diagnosis not present

## 2023-11-27 DIAGNOSIS — K219 Gastro-esophageal reflux disease without esophagitis: Secondary | ICD-10-CM | POA: Diagnosis not present

## 2023-11-27 DIAGNOSIS — I2510 Atherosclerotic heart disease of native coronary artery without angina pectoris: Secondary | ICD-10-CM | POA: Diagnosis not present

## 2023-11-27 DIAGNOSIS — I10 Essential (primary) hypertension: Secondary | ICD-10-CM | POA: Diagnosis not present

## 2023-11-27 DIAGNOSIS — M545 Low back pain, unspecified: Secondary | ICD-10-CM | POA: Diagnosis not present

## 2023-11-27 DIAGNOSIS — Z7951 Long term (current) use of inhaled steroids: Secondary | ICD-10-CM | POA: Diagnosis not present

## 2023-11-27 DIAGNOSIS — M1990 Unspecified osteoarthritis, unspecified site: Secondary | ICD-10-CM | POA: Diagnosis not present

## 2023-11-27 DIAGNOSIS — J9611 Chronic respiratory failure with hypoxia: Secondary | ICD-10-CM | POA: Diagnosis not present

## 2023-12-03 DIAGNOSIS — Z79891 Long term (current) use of opiate analgesic: Secondary | ICD-10-CM | POA: Diagnosis not present

## 2023-12-03 DIAGNOSIS — G894 Chronic pain syndrome: Secondary | ICD-10-CM | POA: Diagnosis not present

## 2023-12-11 DIAGNOSIS — J449 Chronic obstructive pulmonary disease, unspecified: Secondary | ICD-10-CM | POA: Diagnosis not present

## 2023-12-11 DIAGNOSIS — R911 Solitary pulmonary nodule: Secondary | ICD-10-CM | POA: Diagnosis not present

## 2023-12-18 DIAGNOSIS — J4540 Moderate persistent asthma, uncomplicated: Secondary | ICD-10-CM | POA: Diagnosis not present

## 2023-12-20 DIAGNOSIS — L508 Other urticaria: Secondary | ICD-10-CM | POA: Diagnosis not present

## 2023-12-20 DIAGNOSIS — R21 Rash and other nonspecific skin eruption: Secondary | ICD-10-CM | POA: Diagnosis not present

## 2023-12-24 DIAGNOSIS — M47816 Spondylosis without myelopathy or radiculopathy, lumbar region: Secondary | ICD-10-CM | POA: Diagnosis not present

## 2024-01-07 DIAGNOSIS — M47816 Spondylosis without myelopathy or radiculopathy, lumbar region: Secondary | ICD-10-CM | POA: Diagnosis not present

## 2024-01-18 DIAGNOSIS — J4540 Moderate persistent asthma, uncomplicated: Secondary | ICD-10-CM | POA: Diagnosis not present

## 2024-02-10 DIAGNOSIS — M47816 Spondylosis without myelopathy or radiculopathy, lumbar region: Secondary | ICD-10-CM | POA: Diagnosis not present

## 2024-02-11 DIAGNOSIS — Z7689 Persons encountering health services in other specified circumstances: Secondary | ICD-10-CM | POA: Diagnosis not present

## 2024-02-17 DIAGNOSIS — J4540 Moderate persistent asthma, uncomplicated: Secondary | ICD-10-CM | POA: Diagnosis not present

## 2024-03-03 DIAGNOSIS — Z1231 Encounter for screening mammogram for malignant neoplasm of breast: Secondary | ICD-10-CM | POA: Diagnosis not present

## 2024-03-11 DIAGNOSIS — Z122 Encounter for screening for malignant neoplasm of respiratory organs: Secondary | ICD-10-CM | POA: Diagnosis not present

## 2024-03-11 DIAGNOSIS — R918 Other nonspecific abnormal finding of lung field: Secondary | ICD-10-CM | POA: Diagnosis not present

## 2024-03-11 DIAGNOSIS — Z87891 Personal history of nicotine dependence: Secondary | ICD-10-CM | POA: Diagnosis not present

## 2024-03-11 DIAGNOSIS — F17210 Nicotine dependence, cigarettes, uncomplicated: Secondary | ICD-10-CM | POA: Diagnosis not present

## 2024-03-17 DIAGNOSIS — M47816 Spondylosis without myelopathy or radiculopathy, lumbar region: Secondary | ICD-10-CM | POA: Diagnosis not present

## 2024-03-17 DIAGNOSIS — M7061 Trochanteric bursitis, right hip: Secondary | ICD-10-CM | POA: Diagnosis not present

## 2024-03-19 DIAGNOSIS — J4540 Moderate persistent asthma, uncomplicated: Secondary | ICD-10-CM | POA: Diagnosis not present

## 2024-03-25 DIAGNOSIS — R918 Other nonspecific abnormal finding of lung field: Secondary | ICD-10-CM | POA: Diagnosis not present

## 2024-03-25 NOTE — Progress Notes (Unsigned)
 Cardiology Office Note:  .   Date:  03/25/2024  ID:  Natasha Meyer, DOB February 05, 1956, MRN [id number] PCP: Ina Marcellus RAMAN, MD  [facility] Providers Cardiologist:  Lamar Fitch, MD Electrophysiologist:  Soyla Gladis Norton, MD    History of Present Illness: .   Natasha Meyer is a 68 y.o. female with a past medical history of hypertension, COPD, GERD, tobacco abuse, DM2, fibromyalgia, dyslipidemia.  03/26/2023 Lexiscan  normal, low risk study 01/19/2021 echo EF 50-55%, moderate LVH, grade I DD 01/04/2021 lexiscan  normal, low restudy  Most recently evaluated by Dr. Fitch on 11/24/2021, her blood pressure was well-controlled.  She was most bothered by her advanced COPD and was working on stopping smoking.  Has been evaluated by her PCP, they arranged to monitor which revealed an average heart rate of 91 bpm, predominant rhythm was sinus, 1 run of VT occurred for 6 beats, 2 episodes of SVT, SVE's were occasional at 3.5%.  She presents today for an abnormal monitor that was arranged by her PCP.  She apparently had 3 syncopal episodes in the time span of about 15 minutes which prompted the monitor.  She is a poor historian and it is relatively difficult to understand the sequence of events.  She states after she passed out she was taken to the emergency department and transfused with blood.  She then wore the monitor which was largely normal except for 1 run of VT for 6 beats and 2 episodes of SVT, SVE's occasional at 3.5%.  She had had a Lexiscan  on 03/26/2023 which was normal, negative for ischemia.  She offers no formal complaints today.  She is trying to stay well-hydrated.  She is trying to stop smoking, smoking approximately 12 cigarettes/day. She denies chest pain, palpitations, dyspnea, pnd, orthopnea, n, v, dizziness, syncope, edema, weight gain, or early satiety.   ROS: Review of Systems  Respiratory:  Positive for shortness of breath (Baseline).   Cardiovascular:  Positive for  palpitations.  Endo/Heme/Allergies:  Bruises/bleeds easily.  All other systems reviewed and are negative.   Studies Reviewed: .        Cardiac Studies & Procedures   ______________________________________________________________________________________________   STRESS TESTS  MYOCARDIAL PERFUSION IMAGING 01/04/2021  Interpretation Summary  The left ventricular ejection fraction is mildly decreased (45-54%).  Nuclear stress EF: 54%.  There was no ST segment deviation noted during stress.  The study is normal.  This is a low risk study.   ECHOCARDIOGRAM  ECHOCARDIOGRAM COMPLETE 08/22/2023  Narrative ECHOCARDIOGRAM REPORT    Patient Name:   Natasha Meyer Date of Exam: 08/22/2023 Medical Rec #:  [id number]     Height:       58.0 in Accession #:    [id number]    Weight:       125.0 lb Date of Birth:  17-Jun-1956      BSA:          1.491 m Patient Age:    67 years      BP:           135/75 mmHg Patient Gender: F             HR:           85 bpm. Exam Location:  [place]  Procedure: 2D Echo, Cardiac Doppler, Color Doppler and Strain Analysis  Indications:    Dyspnea R06.00  History:        Patient has prior history of Echocardiogram examinations, most recent 01/19/2021. COPD; Risk  Factors:Hypertension, Diabetes and Dyslipidemia.  Sonographer:    Lynwood Silvas RDCS Referring Phys: [phone] DELON JAYSON HOOVER   Sonographer Comments: Suboptimal parasternal window. IMPRESSIONS   1. Left ventricular ejection fraction, by estimation, is 60 to 65%. The left ventricle has normal function. Left ventricular endocardial border not optimally defined to evaluate regional wall motion. Left ventricular diastolic parameters are consistent with Grade I diastolic dysfunction (impaired relaxation). The average left ventricular global longitudinal strain is 9.6 %. The global longitudinal strain is abnormal. 2. Right ventricular systolic function is mildly reduced. The right ventricular size is  normal. There is normal pulmonary artery systolic pressure. 3. The mitral valve is normal in structure. No evidence of mitral valve regurgitation. No evidence of mitral stenosis. 4. The aortic valve was not well visualized. Aortic valve regurgitation is not visualized. No aortic stenosis is present. 5. Aortic DTA is normal. 6. The inferior vena cava is normal in size with greater than 50% respiratory variability, suggesting right atrial pressure of 3 mmHg.  FINDINGS Left Ventricle: Left ventricular ejection fraction, by estimation, is 60 to 65%. The left ventricle has normal function. Left ventricular endocardial border not optimally defined to evaluate regional wall motion. The average left ventricular global longitudinal strain is 9.6 %. The global longitudinal strain is abnormal. The left ventricular internal cavity size was normal in size. There is no left ventricular hypertrophy. Left ventricular diastolic parameters are consistent with Grade I diastolic dysfunction (impaired relaxation). Normal left ventricular filling pressure.  Right Ventricle: The right ventricular size is normal. No increase in right ventricular wall thickness. Right ventricular systolic function is mildly reduced. There is normal pulmonary artery systolic pressure. The tricuspid regurgitant velocity is 1.43 m/s, and with an assumed right atrial pressure of 3 mmHg, the estimated right ventricular systolic pressure is 11.2 mmHg.  Left Atrium: Left atrial size was normal in size.  Right Atrium: Right atrial size was normal in size.  Pericardium: There is no evidence of pericardial effusion.  Mitral Valve: The mitral valve is normal in structure. No evidence of mitral valve regurgitation. No evidence of mitral valve stenosis.  Tricuspid Valve: The tricuspid valve is normal in structure. Tricuspid valve regurgitation is not demonstrated. No evidence of tricuspid stenosis.  Aortic Valve: The aortic valve was not well  visualized. Aortic valve regurgitation is not visualized. No aortic stenosis is present.  Pulmonic Valve: The pulmonic valve was normal in structure. Pulmonic valve regurgitation is not visualized. No evidence of pulmonic stenosis.  Aorta: DTA is normal, the aortic arch was not well visualized and the aortic root and ascending aorta are structurally normal, with no evidence of dilitation.  Venous: The inferior vena cava is normal in size with greater than 50% respiratory variability, suggesting right atrial pressure of 3 mmHg.  IAS/Shunts: No atrial level shunt detected by color flow Doppler.   LEFT VENTRICLE PLAX 2D LVIDd:         3.70 cm   Diastology LVIDs:         2.50 cm   LV e' medial:    4.79 cm/s LV PW:         0.90 cm   LV E/e' medial:  13.2 LV IVS:        0.90 cm   LV e' lateral:   7.62 cm/s LVOT diam:     2.00 cm   LV E/e' lateral: 8.3 LV SV:         49 LV SV Index:   33  2D Longitudinal Strain LVOT Area:     3.14 cm  2D Strain GLS Avg:     9.6 %   RIGHT VENTRICLE            IVC RV Basal diam:  2.30 cm    IVC diam: 1.30 cm RV S prime:     7.51 cm/s TAPSE (M-mode): 1.5 cm  LEFT ATRIUM             Index        RIGHT ATRIUM          Index LA diam:        3.00 cm 2.01 cm/m   RA Area:     6.96 cm LA Vol (A2C):   25.1 ml 16.83 ml/m  RA Volume:   11.50 ml 7.71 ml/m LA Vol (A4C):   21.0 ml 14.08 ml/m LA Biplane Vol: 23.5 ml 15.76 ml/m AORTIC VALVE LVOT Vmax:   88.05 cm/s LVOT Vmean:  56.050 cm/s LVOT VTI:    0.156 m  AORTA Ao Root diam: 3.30 cm Ao Asc diam:  3.40 cm Ao Desc diam: 2.20 cm  MV E velocity: 63.15 cm/s  TRICUSPID VALVE MV A velocity: 89.60 cm/s  TR Peak grad:   8.2 mmHg MV E/A ratio:  0.70        TR Vmax:        143.00 cm/s  SHUNTS Systemic VTI:  0.16 m Systemic Diam: 2.00 cm  Redell Leiter MD Electronically signed by Redell Leiter MD Signature Date/Time: 08/22/2023/12:45:05 PM    Final           ______________________________________________________________________________________________      Risk Assessment/Calculations:     No BP recorded.  {Refresh Note OR Click here to enter BP  :1}***       Physical Exam:   VS:  There were no vitals taken for this visit.   Wt Readings from Last 3 Encounters:  11/12/23 116 lb (52.6 kg)  09/02/23 127 lb 3.2 oz (57.7 kg)  05/24/23 125 lb (56.7 kg)    GEN: Well nourished, well developed in no acute distress NECK: No JVD; No carotid bruits CARDIAC: RRR, no murmurs, rubs, gallops RESPIRATORY:  Clear to auscultation without rales, wheezing or rhonchi  ABDOMEN: Soft, non-tender, non-distended EXTREMITIES:  No edema; No deformity   ASSESSMENT AND PLAN: .   Syncope and collapse-3 events within the time spent 15 minutes as outlined above in the HPI, it appears she was transfused with blood after this.  A monitor was arranged which revealed 1 episode of VT and 2 episodes of SVT.  She has had an ischemic evaluation recently which was negative for ischemia.  Will repeat an echogram for any abnormalities, but it sounds like possibly a been anemia who proposed with dehydration. HTN -blood pressure is controlled 135/75, continue Exforge  5-160 mg daily. COPD/tobacco abuse-typically on oxygen  2 to 3 L daily, she forgot her oxygen  tank today.  She does continue to smoke however she does not smoke while she is wearing her oxygen .  She is aware she needs to completely stop smoking and this has been very difficult and challenging for her but she feels like she can stop. SVT/VT -she is not bothered by her palpitations, they were found incidentally on a recent monitor.  Do not think we need to do an ischemic evaluation as she just had one and it was negative.  Will repeat an echocardiogram for any abnormalities.  Dispo: Echocardiogram, follow-up in 6 months with Dr. Krasowski.  Signed, Delon JAYSON Hoover, NP

## 2024-03-26 DIAGNOSIS — R918 Other nonspecific abnormal finding of lung field: Secondary | ICD-10-CM | POA: Diagnosis not present

## 2024-03-31 ENCOUNTER — Other Ambulatory Visit: Payer: Self-pay

## 2024-03-31 DIAGNOSIS — G894 Chronic pain syndrome: Secondary | ICD-10-CM | POA: Insufficient documentation

## 2024-03-31 DIAGNOSIS — Z79891 Long term (current) use of opiate analgesic: Secondary | ICD-10-CM | POA: Insufficient documentation

## 2024-03-31 DIAGNOSIS — M47816 Spondylosis without myelopathy or radiculopathy, lumbar region: Secondary | ICD-10-CM | POA: Insufficient documentation

## 2024-03-31 DIAGNOSIS — M47817 Spondylosis without myelopathy or radiculopathy, lumbosacral region: Secondary | ICD-10-CM | POA: Insufficient documentation

## 2024-03-31 DIAGNOSIS — M76899 Other specified enthesopathies of unspecified lower limb, excluding foot: Secondary | ICD-10-CM | POA: Insufficient documentation

## 2024-04-01 ENCOUNTER — Encounter: Payer: Self-pay | Admitting: Cardiology

## 2024-04-01 ENCOUNTER — Ambulatory Visit: Attending: Cardiology | Admitting: Cardiology

## 2024-04-01 VITALS — BP 102/58 | HR 99 | Ht <= 58 in | Wt 124.0 lb

## 2024-04-01 DIAGNOSIS — J410 Simple chronic bronchitis: Secondary | ICD-10-CM

## 2024-04-01 DIAGNOSIS — G4733 Obstructive sleep apnea (adult) (pediatric): Secondary | ICD-10-CM

## 2024-04-01 DIAGNOSIS — I10 Essential (primary) hypertension: Secondary | ICD-10-CM

## 2024-04-01 DIAGNOSIS — R0989 Other specified symptoms and signs involving the circulatory and respiratory systems: Secondary | ICD-10-CM

## 2024-04-01 DIAGNOSIS — E782 Mixed hyperlipidemia: Secondary | ICD-10-CM

## 2024-04-01 DIAGNOSIS — R55 Syncope and collapse: Secondary | ICD-10-CM | POA: Insufficient documentation

## 2024-04-01 NOTE — Progress Notes (Unsigned)
 Cardiology Office Note:    Date:  04/01/2024   ID:  Natasha Meyer, DOB 09-17-1955, MRN [id number]  PCP:  Ina Marcellus RAMAN, MD  Cardiologist:  Lamar Fitch, MD    Referring MD: Ina Marcellus RAMAN, MD   No chief complaint on file. Doing fine  History of Present Illness:    Natasha Meyer is a 68 y.o. female past medical history significant for essential hypertension, diabetes which is diet controlled, dyslipidemia, she is a chronic smoker still continues to smoke, advanced COPD on oxygen  chronically.  She was seen last 2 times by my nurse practitioner and the reason for the visit was the fact that her 1 day she got episode of syncope 3 times within 15 minutes is very difficult to get a good story from her but she end up being in a hospital after the blood transfusion had been given after that she wore monitor monitor showed some SVT as well as episode of nonsustained ventricular tachycardia.  Stress test has been done which is negative echocardiogram showed preserved ejection fraction.  Since the time of hospitalization she had no more syncope.  She is doing well she is chronically short of breath use oxygen  all the time.  Denies have any chest pain tightness squeezing pressure burning chest no swelling of lower extremities  Past Medical History:  Diagnosis Date   Aches 11/30/2010   Formatting of this note might be different from the original. Widespread   Anemia    Arthritis    Asthma    Chronic hepatitis C virus infection (HCC) 05/07/2013   Chronic pain syndrome 03/31/2024   Cigarette nicotine dependence with nicotine-induced disorder 11/24/2020   Last Assessment & Plan:  Formatting of this note might be different from the original. I spent a total of 4 minutes with the patient counseling them on smoking cessation efforts.  She is aware of the medical complications of smoking including adverse events of lung cancer, emphysema, poor wound healing etc.  She has attempted to quit in the  past.Barriers to quitting and avoidance of causes of rela   COPD (chronic obstructive pulmonary disease) (HCC) 12/30/2020   Depression    Diabetes mellitus type II, controlled (HCC) 01/30/2018   Enthesopathy of hip region 03/31/2024   Fibromyalgia    GERD (gastroesophageal reflux disease)    Hepatitis    history of C ,took treatments no longer have it   Hyperlipidemia    Hypertension    Long term (current) use of opiate analgesic 03/31/2024   Lumbar spondylosis 03/31/2024   Lumbosacral spondylosis without myelopathy 03/31/2024   Pain in joint, lower leg 11/30/2010   Plantar wart, left foot 01/30/2018   Pneumonia    history   Preop cardiovascular exam 12/30/2020   Primary osteoarthritis of left knee 05/27/2015   S/P total knee replacement 12/24/2016   Sacroiliitis, not elsewhere classified (HCC) 09/01/2020   Last Assessment & Plan:  Formatting of this note might be different from the original. She does describe some nondescript SI joint pain worse on the left.  Some of her symptoms are atypical in her pain is very chronic.  She reportedly has received temporary and repeated relief with multiple SI joint injections in the past.  The SI joint injections been more useful to her the lumbar epidural steroi   Sleep apnea    Spondylolisthesis of lumbar region 11/24/2020   Last Assessment & Plan:  Formatting of this note might be different from the original. I personally reviewed  the MRI of the lumbar spine as well as flexion and extension lumbar x-rays.  This demonstrates a stable degenerative spondylolisthesis at L4-5.  I do not see overt nerve root compression.  Her pain is isolated to the sacroiliac region and does not radiate and therefore I would not recommend   Thumb pain, left 07/05/2016    Past Surgical History:  Procedure Laterality Date   ABDOMINAL HYSTERECTOMY     CARPAL TUNNEL RELEASE Bilateral 1990's   CHOLECYSTECTOMY     HAND TENDON SURGERY Left    graft   JOINT  REPLACEMENT Right 2009   knee   TOTAL KNEE ARTHROPLASTY Left 12/24/2016   Procedure: TOTAL KNEE ARTHROPLASTY;  Surgeon: Rubie Kemps, MD;  Location: [facility] OR;  Service: Orthopedics;  Laterality: Left;    Current Medications: Current Meds  Medication Sig   albuterol  (VENTOLIN  HFA) 108 (90 Base) MCG/ACT inhaler Inhale 2 puffs into the lungs every 6 (six) hours as needed for wheezing or shortness of breath.   amLODipine -valsartan  (EXFORGE ) 5-160 MG tablet Take 1 tablet by mouth daily.   atorvastatin (LIPITOR) 40 MG tablet Take 40 mg by mouth at bedtime.   azelastine (ASTELIN) 0.1 % nasal spray Place 1 spray into both nostrils 2 (two) times daily.   busPIRone (BUSPAR) 15 MG tablet Take 15 mg by mouth 3 (three) times daily.   calcium-vitamin D 250-100 MG-UNIT tablet Take 1 tablet by mouth daily. 500 units   diclofenac Sodium (VOLTAREN) 1 % GEL Apply 2 g topically as needed (pain).   dicyclomine (BENTYL) 10 MG capsule Take 10 mg by mouth daily.   DULoxetine  (CYMBALTA ) 60 MG capsule Take 60 mg by mouth daily.   estradiol  (ESTRACE ) 1 MG tablet Take 1 mg by mouth daily.   famotidine (PEPCID) 40 MG tablet Take 40 mg by mouth at bedtime as needed.   ferrous sulfate 325 (65 FE) MG EC tablet Take 325 mg by mouth daily with breakfast.   fluticasone (FLONASE) 50 MCG/ACT nasal spray Place 1 spray into both nostrils daily.   levalbuterol (XOPENEX HFA) 45 MCG/ACT inhaler Inhale into the lungs as directed.   nortriptyline  (PAMELOR ) 25 MG capsule Take 25 mg by mouth at bedtime.   Oxycodone  HCl 10 MG TABS Take 10 mg by mouth every 5 (five) hours as needed (moderate pain).   pantoprazole  (PROTONIX ) 40 MG tablet Take 40 mg by mouth 2 (two) times daily.   pregabalin  (LYRICA ) 150 MG capsule Take 150 mg by mouth every 8 (eight) hours.   SPIRIVA RESPIMAT 2.5 MCG/ACT AERS Inhale 2 puffs into the lungs daily.   SYMBICORT 160-4.5 MCG/ACT inhaler Inhale 1 puff into the lungs in the morning and at bedtime.   vitamin E 400  UNIT capsule Take 400 Units by mouth daily.     Allergies:   Hylan g-f 20, Glucosamine-chondroitin, Prednisone, Codeine, Lisinopril, Morphine sulfate, Sulfa antibiotics, Albuterol , Latex, and Morphine and codeine   Social History   Socioeconomic History   Marital status: Single    Spouse name: Not on file   Number of children: Not on file   Years of education: Not on file   Highest education level: Not on file  Occupational History   Not on file  Tobacco Use   Smoking status: Every Day    Current packs/day: 0.25    Average packs/day: 0.3 packs/day for 40.0 years (10.0 ttl pk-yrs)    Types: Cigarettes   Smokeless tobacco: Never  Substance and Sexual Activity   Alcohol use:  No   Drug use: No   Sexual activity: Not on file  Other Topics Concern   Not on file  Social History Narrative   Not on file   Social Drivers of Health   Financial Resource Strain: Not on file  Food Insecurity: Not on file  Transportation Needs: Not on file  Physical Activity: Not on file  Stress: Not on file  Social Connections: Not on file     Family History: The patient's family history includes Heart disease in her maternal grandfather, maternal grandmother, and mother; Heart failure in her father. ROS:   Please see the history of present illness.    All 14 point review of systems negative except as described per history of present illness  EKGs/Labs/Other Studies Reviewed:         Recent Labs: No results found for requested labs within last 365 days.  Recent Lipid Panel    Component Value Date/Time   CHOL 160 04/06/2021 1059   TRIG 130 04/06/2021 1059   HDL 63 04/06/2021 1059   CHOLHDL 2.5 04/06/2021 1059   LDLCALC 74 04/06/2021 1059    Physical Exam:    VS:  BP (!) 102/58   Pulse 99   Ht 4' 10 (1.473 m)   Wt 124 lb (56.2 kg)   SpO2 96%   BMI 25.92 kg/m     Wt Readings from Last 3 Encounters:  04/01/24 124 lb (56.2 kg)  11/12/23 116 lb (52.6 kg)  09/02/23 127 lb 3.2  oz (57.7 kg)     GEN:  Well nourished, well developed in no acute distress HEENT: Normal NECK: No JVD; No carotid bruits LYMPHATICS: No lymphadenopathy CARDIAC: RRR, no murmurs, no rubs, no gallops RESPIRATORY:  Clear to auscultation without rales, wheezing or rhonchi  ABDOMEN: Soft, non-tender, non-distended MUSCULOSKELETAL:  No edema; No deformity  SKIN: Warm and dry LOWER EXTREMITIES: no swelling NEUROLOGIC:  Alert and oriented x 3 PSYCHIATRIC:  Normal affect   ASSESSMENT:    1. Primary hypertension   2. Mixed hyperlipidemia   3. Obstructive sleep apnea syndrome   4. Simple chronic bronchitis (HCC)   5. Syncope and collapse    PLAN:    In order of problems listed above:  Syncope I think it was related to anemia and her advanced COPD rather than arrhythmia.  After transfusion and management of her COPD and oxygen  she seems to be doing better.  Will continue monitoring. Multiple risk factors for atherosclerosis, likely stress test was negative but I will schedule her to have carotic ultrasound to make sure she does not have any cardiac arterial disease.  She was taking aspirin  daily but she surprisingly developed side effect of headache after that.  Therefore, if she does have peripheral vascular disease will initiate Plavix. COPD which is advanced again she had a long discussion about need to quit smoking she understand but honestly I have low hope that she would be able to quit I hope she will surprise me.  Dyslipidemia I did review K PN which show me her total cholesterol 148 HDL 80.  This is from 01/23/2023.  Will make arrangements in the future to have her fasting lipid profile redone.   Medication Adjustments/Labs and Tests Ordered: Current medicines are reviewed at length with the patient today.  Concerns regarding medicines are outlined above.  No orders of the defined types were placed in this encounter.  Medication changes: No orders of the defined types were placed in  this encounter.  Signed, Lamar DOROTHA Fitch, MD, [facility] 04/01/2024 1:52 PM    [facility]

## 2024-04-01 NOTE — Patient Instructions (Signed)
Medication Instructions:  Your physician recommends that you continue on your current medications as directed. Please refer to the Current Medication list given to you today.  *If you need a refill on your cardiac medications before your next appointment, please call your pharmacy*   Lab Work: None Ordered If you have labs (blood work) drawn today and your tests are completely normal, you will receive your results only by: MyChart Message (if you have MyChart) OR A paper copy in the mail If you have any lab test that is abnormal or we need to change your treatment, we will call you to review the results.   Testing/Procedures: Your physician has requested that you have a carotid duplex. This test is an ultrasound of the carotid arteries in your neck. It looks at blood flow through these arteries that supply the brain with blood. Allow one hour for this exam. There are no restrictions or special instructions.    Follow-Up: At [facility], you and your health needs are our priority.  As part of our continuing mission to provide you with exceptional heart care, we have created designated Provider Care Teams.  These Care Teams include your primary Cardiologist (physician) and Advanced Practice Providers (APPs -  Physician Assistants and Nurse Practitioners) who all work together to provide you with the care you need, when you need it.  We recommend signing up for the patient portal called "MyChart".  Sign up information is provided on this After Visit Summary.  MyChart is used to connect with patients for Virtual Visits (Telemedicine).  Patients are able to view lab/test results, encounter notes, upcoming appointments, etc.  Non-urgent messages can be sent to your provider as well.   To learn more about what you can do with MyChart, go to [email or web address].    Your next appointment:   5 month(s)  The format for your next appointment:   In Person  Provider:   Robert Krasowski, MD     Other Instructions NA  

## 2024-04-07 DIAGNOSIS — E119 Type 2 diabetes mellitus without complications: Secondary | ICD-10-CM | POA: Diagnosis not present

## 2024-04-07 DIAGNOSIS — E611 Iron deficiency: Secondary | ICD-10-CM | POA: Diagnosis not present

## 2024-04-14 DIAGNOSIS — M7061 Trochanteric bursitis, right hip: Secondary | ICD-10-CM | POA: Diagnosis not present

## 2024-04-19 DIAGNOSIS — J4540 Moderate persistent asthma, uncomplicated: Secondary | ICD-10-CM | POA: Diagnosis not present

## 2024-04-21 DIAGNOSIS — K219 Gastro-esophageal reflux disease without esophagitis: Secondary | ICD-10-CM | POA: Diagnosis not present

## 2024-04-23 ENCOUNTER — Ambulatory Visit: Attending: Cardiology

## 2024-04-23 DIAGNOSIS — E119 Type 2 diabetes mellitus without complications: Secondary | ICD-10-CM

## 2024-04-23 DIAGNOSIS — R55 Syncope and collapse: Secondary | ICD-10-CM

## 2024-04-23 DIAGNOSIS — Z720 Tobacco use: Secondary | ICD-10-CM | POA: Diagnosis not present

## 2024-04-23 DIAGNOSIS — I10 Essential (primary) hypertension: Secondary | ICD-10-CM | POA: Diagnosis not present

## 2024-04-23 DIAGNOSIS — E785 Hyperlipidemia, unspecified: Secondary | ICD-10-CM

## 2024-04-23 DIAGNOSIS — R0989 Other specified symptoms and signs involving the circulatory and respiratory systems: Secondary | ICD-10-CM

## 2024-04-24 DIAGNOSIS — Z79891 Long term (current) use of opiate analgesic: Secondary | ICD-10-CM | POA: Diagnosis not present

## 2024-04-24 DIAGNOSIS — M47816 Spondylosis without myelopathy or radiculopathy, lumbar region: Secondary | ICD-10-CM | POA: Diagnosis not present

## 2024-04-24 DIAGNOSIS — M7061 Trochanteric bursitis, right hip: Secondary | ICD-10-CM | POA: Diagnosis not present

## 2024-04-28 DIAGNOSIS — M65342 Trigger finger, left ring finger: Secondary | ICD-10-CM | POA: Diagnosis not present

## 2024-04-28 DIAGNOSIS — M65332 Trigger finger, left middle finger: Secondary | ICD-10-CM | POA: Diagnosis not present

## 2024-04-28 DIAGNOSIS — M65352 Trigger finger, left little finger: Secondary | ICD-10-CM | POA: Diagnosis not present

## 2024-05-06 ENCOUNTER — Ambulatory Visit: Payer: Self-pay | Admitting: Cardiology

## 2024-05-11 ENCOUNTER — Telehealth: Payer: Self-pay

## 2024-05-11 NOTE — Telephone Encounter (Signed)
 Left message on My Chart with Carotid US  results per Dr. Karry note. Routed to PCP.

## 2024-05-12 DIAGNOSIS — R748 Abnormal levels of other serum enzymes: Secondary | ICD-10-CM | POA: Diagnosis not present

## 2024-05-13 DIAGNOSIS — R748 Abnormal levels of other serum enzymes: Secondary | ICD-10-CM | POA: Diagnosis not present

## 2024-05-19 DIAGNOSIS — J4540 Moderate persistent asthma, uncomplicated: Secondary | ICD-10-CM | POA: Diagnosis not present

## 2024-05-25 DIAGNOSIS — Z9049 Acquired absence of other specified parts of digestive tract: Secondary | ICD-10-CM | POA: Diagnosis not present

## 2024-05-25 DIAGNOSIS — R748 Abnormal levels of other serum enzymes: Secondary | ICD-10-CM | POA: Diagnosis not present

## 2024-06-05 DIAGNOSIS — Z79891 Long term (current) use of opiate analgesic: Secondary | ICD-10-CM | POA: Diagnosis not present

## 2024-06-05 DIAGNOSIS — M47816 Spondylosis without myelopathy or radiculopathy, lumbar region: Secondary | ICD-10-CM | POA: Diagnosis not present

## 2024-06-05 DIAGNOSIS — M7061 Trochanteric bursitis, right hip: Secondary | ICD-10-CM | POA: Diagnosis not present

## 2024-06-09 DIAGNOSIS — M65342 Trigger finger, left ring finger: Secondary | ICD-10-CM | POA: Diagnosis not present

## 2024-06-12 DIAGNOSIS — R911 Solitary pulmonary nodule: Secondary | ICD-10-CM | POA: Diagnosis not present

## 2024-06-12 DIAGNOSIS — I2510 Atherosclerotic heart disease of native coronary artery without angina pectoris: Secondary | ICD-10-CM | POA: Diagnosis not present

## 2024-06-12 DIAGNOSIS — J439 Emphysema, unspecified: Secondary | ICD-10-CM | POA: Diagnosis not present

## 2024-06-12 DIAGNOSIS — R918 Other nonspecific abnormal finding of lung field: Secondary | ICD-10-CM | POA: Diagnosis not present

## 2024-06-19 DIAGNOSIS — J4540 Moderate persistent asthma, uncomplicated: Secondary | ICD-10-CM | POA: Diagnosis not present

## 2024-07-09 ENCOUNTER — Telehealth: Payer: Self-pay | Admitting: *Deleted

## 2024-07-09 NOTE — Telephone Encounter (Signed)
° °  Pre-operative Risk Assessment    Patient Name: Natasha Meyer  DOB: [birth year] MRN: [id number]      Request for Surgical Clearance    Procedure:  Colonoscopy  Date of Surgery:  Clearance TBD                                 Surgeon:  Dr. Evalene ALF [place] Surgeon's Group or Practice Name:   [facility] Phone number:  [phone] Fax number:  [phone]   Type of Clearance Requested:   - Medical  - Pharmacy:  Hold Aspirin  5 days prior   Type of Anesthesia:  Not Indicated   Additional requests/questions:    Bonney Arloa Donovan Levorn   07/09/2024, 4:24 PM

## 2024-07-10 ENCOUNTER — Telehealth: Payer: Self-pay

## 2024-07-10 NOTE — Telephone Encounter (Signed)
 1st attempt to reach pt regarding surgical clearance and the need for an TELE appointment.  Left pt a detailed message to call back and get that scheduled.

## 2024-07-10 NOTE — Telephone Encounter (Signed)
" ° °  Name: Natasha Meyer  DOB: [birth year]  MRN: [id number]  Primary Cardiologist: Lamar Fitch, MD  Chart reviewed as part of pre-operative protocol coverage. Because of Natasha Meyer's past medical history and time since last visit, she will require a follow-up telephone visit  in order to better assess preoperative cardiovascular risk.  Pre-op covering staff: - Please schedule appointment and call patient to inform them. If patient already had an upcoming appointment within acceptable timeframe, please add pre-op clearance to the appointment notes so provider is aware. - Please contact requesting surgeon's office via preferred method (i.e, phone, fax) to inform them of need for appointment prior to surgery.  During phone call, need to verify that patient is no longer taking aspirin  or Plavix.   Mardy KATHEE Pizza, FNP  07/10/2024, 9:25 AM   "

## 2024-07-10 NOTE — Telephone Encounter (Signed)
"  °  Patient Consent for Virtual Visit        Natasha Meyer has provided verbal consent on 07/10/2024 for a virtual visit (video or telephone).   CONSENT FOR VIRTUAL VISIT FOR:  Natasha Meyer  By participating in this virtual visit I agree to the following:  I hereby voluntarily request, consent and authorize [facility] and its employed or contracted physicians, physician assistants, nurse practitioners or other licensed health care professionals (the Practitioner), to provide me with telemedicine health care services (the Services) as deemed necessary by the treating Practitioner. I acknowledge and consent to receive the Services by the Practitioner via telemedicine. I understand that the telemedicine visit will involve communicating with the Practitioner through live audiovisual communication technology and the disclosure of certain medical information by electronic transmission. I acknowledge that I have been given the opportunity to request an in-person assessment or other available alternative prior to the telemedicine visit and am voluntarily participating in the telemedicine visit.  I understand that I have the right to withhold or withdraw my consent to the use of telemedicine in the course of my care at any time, without affecting my right to future care or treatment, and that the Practitioner or I may terminate the telemedicine visit at any time. I understand that I have the right to inspect all information obtained and/or recorded in the course of the telemedicine visit and may receive copies of available information for a reasonable fee.  I understand that some of the potential risks of receiving the Services via telemedicine include:  Delay or interruption in medical evaluation due to technological equipment failure or disruption; Information transmitted may not be sufficient (e.g. poor resolution of images) to allow for appropriate medical decision making by the Practitioner;  and/or  In rare instances, security protocols could fail, causing a breach of personal health information.  Furthermore, I acknowledge that it is my responsibility to provide information about my medical history, conditions and care that is complete and accurate to the best of my ability. I acknowledge that Practitioner's advice, recommendations, and/or decision may be based on factors not within their control, such as incomplete or inaccurate data provided by me or distortions of diagnostic images or specimens that may result from electronic transmissions. I understand that the practice of medicine is not an exact science and that Practitioner makes no warranties or guarantees regarding treatment outcomes. I acknowledge that a copy of this consent can be made available to me via my patient portal [facility] MyChart), or I can request a printed copy by calling the office of  [facility].    I understand that my insurance will be billed for this visit.   I have read or had this consent read to me. I understand the contents of this consent, which adequately explains the benefits and risks of the Services being provided via telemedicine.  I have been provided ample opportunity to ask questions regarding this consent and the Services and have had my questions answered to my satisfaction. I give my informed consent for the services to be provided through the use of telemedicine in my medical care    "

## 2024-07-19 DIAGNOSIS — J4540 Moderate persistent asthma, uncomplicated: Secondary | ICD-10-CM | POA: Diagnosis not present

## 2024-07-21 ENCOUNTER — Ambulatory Visit: Attending: Cardiology | Admitting: Physician Assistant

## 2024-07-21 DIAGNOSIS — Z01810 Encounter for preprocedural cardiovascular examination: Secondary | ICD-10-CM | POA: Diagnosis not present

## 2024-07-21 NOTE — Progress Notes (Signed)
 "   Virtual Visit via Telephone Note   Because of Natasha Meyer co-morbid illnesses, she is at least at moderate risk for complications without adequate follow up.  This format is felt to be most appropriate for this patient at this time.  Due to technical limitations with video connection web designer), today's appointment will be conducted as an audio only telehealth visit, and Natasha Meyer verbally agreed to proceed in this manner.   All issues noted in this document were discussed and addressed.  No physical exam could be performed with this format.  Evaluation Performed:  Preoperative cardiovascular risk assessment _____________   Date:  07/21/2024   Patient ID:  Natasha Meyer, DOB 02-28-1956, MRN [id number] Patient Location:  Home Provider location:   Office  Primary Care Provider:  Ina Marcellus RAMAN, MD Primary Cardiologist:  Natasha Fitch, MD  Chief Complaint / Patient Profile   68 y.o. y/o female with a h/o HLD, HTN, DM type 2, GERD who is pending endoscopy and presents today for telephonic preoperative cardiovascular risk assessment.  History of Present Illness    Natasha Meyer is a 68 y.o. female who presents via audio/video conferencing for a telehealth visit today.  Pt was last seen in cardiology clinic on 04/01/24 by Dr. Fitch.  At that time Natasha Meyer was doing well .  The patient is now pending procedure as outlined above. Since her last visit, she sometimes she does have some SOB with walking and going up hill a little. She takes her dog out to use the bathroom. When she comes back in she does okay. She does not have any chest pains. She does use the stairs when she is at her moms house because she has stairs in her home. She does meet minimum mets. No more syncope.   She is not on any blood thinners and no medications are indicated as needing held.   Past Medical History    Past Medical History:  Diagnosis Date   Aches 11/30/2010   Formatting of this note might  be different from the original. Widespread   Anemia    Arthritis    Asthma    Chronic hepatitis C virus infection (HCC) 05/07/2013   Chronic pain syndrome 03/31/2024   Cigarette nicotine dependence with nicotine-induced disorder 11/24/2020   Last Assessment & Plan:  Formatting of this note might be different from the original. I spent a total of 4 minutes with the patient counseling them on smoking cessation efforts.  She is aware of the medical complications of smoking including adverse events of lung cancer, emphysema, poor wound healing etc.  She has attempted to quit in the past.Barriers to quitting and avoidance of causes of rela   COPD (chronic obstructive pulmonary disease) (HCC) 12/30/2020   Depression    Diabetes mellitus type II, controlled (HCC) 01/30/2018   Enthesopathy of hip region 03/31/2024   Fibromyalgia    GERD (gastroesophageal reflux disease)    Hepatitis    history of C ,took treatments no longer have it   Hyperlipidemia    Hypertension    Long term (current) use of opiate analgesic 03/31/2024   Lumbar spondylosis 03/31/2024   Lumbosacral spondylosis without myelopathy 03/31/2024   Pain in joint, lower leg 11/30/2010   Plantar wart, left foot 01/30/2018   Pneumonia    history   Preop cardiovascular exam 12/30/2020   Primary osteoarthritis of left knee 05/27/2015   S/P total knee replacement 12/24/2016   Sacroiliitis, not  elsewhere classified 09/01/2020   Last Assessment & Plan:  Formatting of this note might be different from the original. She does describe some nondescript SI joint pain worse on the left.  Some of her symptoms are atypical in her pain is very chronic.  She reportedly has received temporary and repeated relief with multiple SI joint injections in the past.  The SI joint injections been more useful to her the lumbar epidural steroi   Sleep apnea    Spondylolisthesis of lumbar region 11/24/2020   Last Assessment & Plan:  Formatting of this note  might be different from the original. I personally reviewed the MRI of the lumbar spine as well as flexion and extension lumbar x-rays.  This demonstrates a stable degenerative spondylolisthesis at L4-5.  I do not see overt nerve root compression.  Her pain is isolated to the sacroiliac region and does not radiate and therefore I would not recommend   Thumb pain, left 07/05/2016   Past Surgical History:  Procedure Laterality Date   ABDOMINAL HYSTERECTOMY     CARPAL TUNNEL RELEASE Bilateral 1990's   CHOLECYSTECTOMY     HAND TENDON SURGERY Left    graft   JOINT REPLACEMENT Right 2009   knee   TOTAL KNEE ARTHROPLASTY Left 12/24/2016   Procedure: TOTAL KNEE ARTHROPLASTY;  Surgeon: Natasha Kemps, MD;  Location: [facility] OR;  Service: Orthopedics;  Laterality: Left;    Allergies  Allergies[1]  Home Medications    Prior to Admission medications  Medication Sig Start Date End Date Taking? Authorizing Provider  Acetaminophen  (PAIN RELIEF PO) Take 1 tablet by mouth as needed (Pain).    [provider]  albuterol  (VENTOLIN  HFA) 108 (90 Base) MCG/ACT inhaler Inhale 2 puffs into the lungs every 6 (six) hours as needed for wheezing or shortness of breath.    [provider]  amLODipine -valsartan  (EXFORGE ) 5-160 MG tablet Take 1 tablet by mouth daily.    [provider]  atorvastatin (LIPITOR) 40 MG tablet Take 40 mg by mouth at bedtime. 10/23/23   [provider]  azelastine (ASTELIN) 0.1 % nasal spray Place 1 spray into both nostrils 2 (two) times daily. 05/13/23   [provider]  busPIRone (BUSPAR) 15 MG tablet Take 15 mg by mouth 3 (three) times daily. 03/27/24   [provider]  calcium-vitamin D 250-100 MG-UNIT tablet Take 1 tablet by mouth daily. 500 units    [provider]  diclofenac Sodium (VOLTAREN) 1 % GEL Apply 2 g topically as needed (pain).    [provider]  dicyclomine (BENTYL) 10 MG capsule Take 10 mg by mouth daily.     [provider]  DULoxetine  (CYMBALTA ) 60 MG capsule Take 60 mg by mouth daily.    [provider]  estradiol  (ESTRACE ) 1 MG tablet Take 1 mg by mouth daily. 02/17/24   [provider]  famotidine (PEPCID) 40 MG tablet Take 40 mg by mouth at bedtime as needed. 02/23/24   [provider]  ferrous sulfate 325 (65 FE) MG EC tablet Take 325 mg by mouth daily with breakfast.    [provider]  fluticasone (FLONASE) 50 MCG/ACT nasal spray Place 1 spray into both nostrils daily.    [provider]  levalbuterol (XOPENEX HFA) 45 MCG/ACT inhaler Inhale into the lungs as directed.    [provider]  nortriptyline  (PAMELOR ) 25 MG capsule Take 25 mg by mouth at bedtime.    [provider]  Oxycodone  HCl 10  MG TABS Take 10 mg by mouth every 5 (five) hours as needed (moderate pain).    [provider]  pantoprazole  (PROTONIX ) 40 MG tablet Take 40 mg by mouth 2 (two) times daily.    [provider]  pregabalin  (LYRICA ) 150 MG capsule Take 150 mg by mouth every 8 (eight) hours.    [provider]  SPIRIVA RESPIMAT 2.5 MCG/ACT AERS Inhale 2 puffs into the lungs daily. 06/15/23   [provider]  SYMBICORT 160-4.5 MCG/ACT inhaler Inhale 1 puff into the lungs in the morning and at bedtime. 06/15/23   [provider]  vitamin E 400 UNIT capsule Take 400 Units by mouth daily.    [provider]    Physical Exam    Vital Signs:  EVEY MCMAHAN does not have vital signs available for review today.  Given telephonic nature of communication, physical exam is limited. AAOx3. NAD. Normal affect.  Speech and respirations are unlabored.  Accessory Clinical Findings    None  Assessment & Plan    1.  Preoperative Cardiovascular Risk Assessment:  Ms. Ki perioperative risk of a major cardiac event is 0.4% according to the Revised Cardiac Risk Index (RCRI).  Therefore, she is at low risk for  perioperative complications.   Her functional capacity is fair at 5.07 METs according to the Duke Activity Status Index (DASI). Recommendations: According to ACC/AHA guidelines, no further cardiovascular testing needed.  The patient may proceed to surgery at acceptable risk.    The patient was advised that if she develops new symptoms prior to surgery to contact our office to arrange for a follow-up visit, and she verbalized understanding.  A copy of this note will be routed to requesting surgeon.  Time:   Today, I have spent 8 minutes with the patient with telehealth technology discussing medical history, symptoms, and management plan.     Orren LOISE Fabry, PA-C  07/21/2024, 9:22 AM      [1]  Allergies Allergen Reactions   Hylan G-F 20 Swelling    SWELLING REACTION UNSPECIFIED    Glucosamine-Chondroitin Swelling    SWELLING REACTION UNSPECIFIED    Prednisone Hypertension   Codeine Other (See Comments)    Passed out with tylenol  #3 but has tolerated other forms of tylenol  so allergy entered for codeine.   Lisinopril     SWELLING REACTION UNSPECIFIED    Morphine Sulfate     Other Reaction(s): hallucinations   Sulfa Antibiotics     SWELLING REACTION UNSPECIFIED    Albuterol  Other (See Comments)    INTOLERANCE > TACHYCARDIA   Latex Dermatitis and Rash   Morphine And Codeine Other (See Comments)    hallucinations   "

## 2024-09-22 ENCOUNTER — Ambulatory Visit: Payer: Self-pay | Admitting: Cardiology
